# Patient Record
Sex: Female | Born: 1975 | Race: White | Hispanic: No | Marital: Single | Smoking: Current some day smoker
Health system: Southern US, Community
[De-identification: ages and names within clinical notes are randomized; demographics above are authoritative.]

## PROBLEM LIST (undated history)

## (undated) DIAGNOSIS — I341 Nonrheumatic mitral (valve) prolapse: Secondary | ICD-10-CM

## (undated) DIAGNOSIS — Z8489 Family history of other specified conditions: Secondary | ICD-10-CM

## (undated) DIAGNOSIS — F32A Depression, unspecified: Secondary | ICD-10-CM

## (undated) DIAGNOSIS — F419 Anxiety disorder, unspecified: Secondary | ICD-10-CM

## (undated) DIAGNOSIS — R519 Headache, unspecified: Secondary | ICD-10-CM

## (undated) DIAGNOSIS — K219 Gastro-esophageal reflux disease without esophagitis: Secondary | ICD-10-CM

## (undated) HISTORY — PX: OTHER SURGICAL HISTORY: SHX169

---

## 2007-11-12 ENCOUNTER — Emergency Department (HOSPITAL_COMMUNITY): Admission: EM | Admit: 2007-11-12 | Discharge: 2007-11-12 | Payer: Self-pay | Admitting: Emergency Medicine

## 2008-01-25 ENCOUNTER — Emergency Department (HOSPITAL_COMMUNITY): Admission: EM | Admit: 2008-01-25 | Discharge: 2008-01-25 | Payer: Self-pay | Admitting: Emergency Medicine

## 2008-04-23 ENCOUNTER — Other Ambulatory Visit: Admission: RE | Admit: 2008-04-23 | Discharge: 2008-04-23 | Payer: Self-pay | Admitting: Obstetrics and Gynecology

## 2008-09-06 ENCOUNTER — Encounter: Admission: RE | Admit: 2008-09-06 | Discharge: 2008-09-06 | Payer: Self-pay | Admitting: Surgical Oncology

## 2010-05-03 ENCOUNTER — Other Ambulatory Visit: Admission: RE | Admit: 2010-05-03 | Discharge: 2010-05-03 | Payer: Self-pay | Admitting: Obstetrics and Gynecology

## 2011-06-23 LAB — I-STAT 8, (EC8 V) (CONVERTED LAB)
Acid-Base Excess: 1
Chloride: 105
Glucose, Bld: 209 — ABNORMAL HIGH
Hemoglobin: 15.3 — ABNORMAL HIGH
Potassium: 3.7
Sodium: 136
pH, Ven: 7.532 — ABNORMAL HIGH

## 2011-06-27 LAB — URINALYSIS, ROUTINE W REFLEX MICROSCOPIC
Ketones, ur: >80 — AB
Nitrite: NEGATIVE
Specific Gravity, Urine: 1.027
Urobilinogen, UA: 0.2
pH: 6.5

## 2019-07-07 ENCOUNTER — Ambulatory Visit
Admission: EM | Admit: 2019-07-07 | Discharge: 2019-07-07 | Disposition: A | Discharge status: Home / Self Care | Payer: No Typology Code available for payment source

## 2019-07-07 DIAGNOSIS — R1011 Right upper quadrant pain: Secondary | ICD-10-CM | POA: Diagnosis not present

## 2019-07-07 HISTORY — DX: Nonrheumatic mitral (valve) prolapse: I34.1

## 2019-07-07 NOTE — ED Provider Notes (Signed)
[facility]    CSN: [id number] Arrival date & time: 07/07/19  [id number]      History   Chief Complaint Chief Complaint  Patient presents with  . Abdominal Pain    HPI SHARMEKA Mercado is a 43 y.o. female presenting for right upper quadrant pain since Wednesday.  States is been waxing and waning, though over the last few days has become more constant.  States that it is "like a full sensation ".  States is worse after eating, though now just "all the time ".  Patient states pain radiates to her epigastric area, mid back.  Patient endorsing nausea and early satiety, no vomiting.  Patient has not had episodes similar to this previous to Wednesday.  Patient does report history of gastric ulcers and H. pylori, though states "this does not feel like any of those ".  States that this occurred in 2001.  Patient denies history of abdominal surgery, though states that her paternal aunt, grandmother, and her sister all had their gallbladder removed.   Past Medical History:  Diagnosis Date  . Mitral valve prolapse     There are no active problems to display for this patient.   History reviewed. No pertinent surgical history.  OB History   No obstetric history on file.      Home Medications    Prior to Admission medications   Not on File    Family History No family history on file.  Social History Social History   Tobacco Use  . Smoking status: Current Some Day Smoker  . Smokeless tobacco: Never Used  Substance Use Topics  . Alcohol use: Yes  . Drug use: Not on file     Allergies   Patient has no known allergies.   Review of Systems Review of Systems  Constitutional: Positive for appetite change. Negative for fatigue and fever.  Eyes: Negative for pain, redness and visual disturbance.  Respiratory: Negative for cough and shortness of breath.   Cardiovascular: Negative for chest pain and palpitations.  Gastrointestinal: Positive for abdominal pain. Negative  for abdominal distention, blood in stool, diarrhea and vomiting.  Musculoskeletal: Negative for arthralgias and myalgias.  Skin: Negative for rash and wound.  Neurological: Negative for weakness and headaches.     Physical Exam Triage Vital Signs ED Triage Vitals  Enc Vitals Group     BP      Pulse      Resp      Temp      Temp src      SpO2      Weight      Height      Head Circumference      Peak Flow      Pain Score      Pain Loc      Pain Edu?      Excl. in [place]?    No data found.  Updated Vital Signs BP (!) 143/84 (BP Location: Left Arm)   Pulse 95   Temp 98.2 F (36.8 C) (Oral)   Resp 20   LMP 07/05/2019   SpO2 96%   Visual Acuity Right Eye Distance:   Left Eye Distance:   Bilateral Distance:    Right Eye Near:   Left Eye Near:    Bilateral Near:     Physical Exam Constitutional:      General: She is not in acute distress.    Appearance: She is well-developed. She is not toxic-appearing.  HENT:  Head: Normocephalic and atraumatic.  Eyes:     General: No scleral icterus.    Pupils: Pupils are equal, round, and reactive to light.  Cardiovascular:     Rate and Rhythm: Normal rate and regular rhythm.     Heart sounds: No murmur. No gallop.   Pulmonary:     Effort: Pulmonary effort is normal. No respiratory distress.     Breath sounds: No wheezing.  Abdominal:     General: Abdomen is flat. Bowel sounds are normal. There is no distension or abdominal bruit.     Palpations: Abdomen is soft. There is no hepatomegaly, splenomegaly or pulsatile mass.     Tenderness: There is abdominal tenderness in the right upper quadrant. There is no right CVA tenderness, left CVA tenderness, guarding or rebound. Positive signs include Murphy's sign. Negative signs include Rovsing's sign and McBurney's sign.     Hernia: No hernia is present.  Skin:    Coloration: Skin is not jaundiced or pale.  Neurological:     Mental Status: She is alert and oriented to person,  place, and time.      UC Treatments / Results  Labs (all labs ordered are listed, but only abnormal results are displayed) Labs Reviewed - No data to display  EKG   Radiology No results found.  Procedures Procedures (including critical care time)  Medications Ordered in UC Medications - No data to display  Initial Impression / Assessment and Plan / UC Course  I have reviewed the triage vital signs and the nursing notes.  Pertinent labs & imaging results that were available during my care of the patient were reviewed by me and considered in my medical decision making (see chart for details).     1.  Right upper quadrant pain History and physical highly suggestive of cholelithiasis, possible cholecystitis.  Patient is afebrile, nontoxic, hemodynamically stable.  Lower suspicion for gastritis, ulcer, H. pylori given patient's history.  Provided surgical contact information for further evaluation as patient is not routinely followed by primary care.  Reviewed strict ER return precautions in the interim, patient verbalized understanding and is agreeable to plan. Final Clinical Impressions(s) / UC Diagnoses   Final diagnoses:  RUQ pain     Discharge Instructions     Eat small, low-fat meals & drink plenty of water throughout the day. Go to ER sooner than surgical appointment if you develop fever, worsening pain, vomiting    ED Prescriptions    None     PDMP not reviewed this encounter.   [place], [place], [place] 07/07/19 1025

## 2019-07-07 NOTE — Discharge Instructions (Addendum)
Eat small, low-fat meals & drink plenty of water throughout the day. Go to ER sooner than surgical appointment if you develop fever, worsening pain, vomiting

## 2019-07-07 NOTE — ED Triage Notes (Signed)
Pt c/o upper rt/center abdominal pain radiating through to back since last Wednesday. States worse after eating.

## 2019-07-09 ENCOUNTER — Other Ambulatory Visit: Payer: Self-pay

## 2019-07-09 ENCOUNTER — Other Ambulatory Visit: Payer: Self-pay | Admitting: Surgery

## 2019-07-09 ENCOUNTER — Ambulatory Visit (HOSPITAL_COMMUNITY)
Admission: RE | Admit: 2019-07-09 | Discharge: 2019-07-09 | Disposition: A | Discharge status: Home / Self Care | Payer: PRIVATE HEALTH INSURANCE | Source: Ambulatory Visit | Attending: Surgery | Admitting: Surgery

## 2019-07-09 DIAGNOSIS — R1013 Epigastric pain: Secondary | ICD-10-CM | POA: Insufficient documentation

## 2019-07-11 ENCOUNTER — Other Ambulatory Visit (HOSPITAL_COMMUNITY): Payer: Self-pay | Admitting: Surgery

## 2019-07-11 ENCOUNTER — Other Ambulatory Visit: Payer: Self-pay | Admitting: Surgery

## 2019-07-11 DIAGNOSIS — R1013 Epigastric pain: Secondary | ICD-10-CM

## 2019-07-25 ENCOUNTER — Other Ambulatory Visit: Payer: Self-pay

## 2019-07-25 ENCOUNTER — Ambulatory Visit (HOSPITAL_COMMUNITY)
Admission: RE | Admit: 2019-07-25 | Discharge: 2019-07-25 | Disposition: A | Discharge status: Home / Self Care | Payer: PRIVATE HEALTH INSURANCE | Source: Ambulatory Visit | Attending: Surgery | Admitting: Surgery

## 2019-07-25 DIAGNOSIS — R1013 Epigastric pain: Secondary | ICD-10-CM | POA: Insufficient documentation

## 2019-07-25 MED ORDER — TECHNETIUM TC 99M MEBROFENIN IV KIT
5.0000 | PACK | Freq: Once | INTRAVENOUS | Status: AC | PRN
  Administered 2019-07-25: 5 via INTRAVENOUS

## 2019-11-04 ENCOUNTER — Other Ambulatory Visit: Payer: Self-pay | Admitting: Gastroenterology

## 2019-11-04 DIAGNOSIS — R109 Unspecified abdominal pain: Secondary | ICD-10-CM

## 2019-11-05 ENCOUNTER — Telehealth: Payer: Self-pay

## 2019-11-05 NOTE — Telephone Encounter (Signed)
Phone call to patient to review instructions for 13 hr prep for CT w/ contrast on 11/12/2019  at 1140 AM. Prescription called into [facility]. Pt aware and verbalized understanding of instructions. Prescription: 11/11/19 1040 PM- 50mg  Prednisone 11/12/19 440 AM- 50mg  Prednisone 11/12/19 1040 AM - 50mg  Prednisone and 50mg  Benadryl

## 2019-11-12 ENCOUNTER — Ambulatory Visit
Admission: RE | Admit: 2019-11-12 | Discharge: 2019-11-12 | Disposition: A | Discharge status: Home / Self Care | Payer: PRIVATE HEALTH INSURANCE | Source: Ambulatory Visit | Attending: Gastroenterology | Admitting: Gastroenterology

## 2019-11-12 ENCOUNTER — Other Ambulatory Visit: Payer: Self-pay

## 2019-11-12 DIAGNOSIS — R109 Unspecified abdominal pain: Secondary | ICD-10-CM

## 2019-11-12 MED ORDER — IOPAMIDOL (ISOVUE-300) INJECTION 61%
100.0000 mL | Freq: Once | INTRAVENOUS | Status: AC | PRN
  Administered 2019-11-12: 12:00:00 100 mL via INTRAVENOUS

## 2020-01-06 ENCOUNTER — Other Ambulatory Visit: Payer: Self-pay | Admitting: Nurse Practitioner

## 2020-01-06 DIAGNOSIS — Z1231 Encounter for screening mammogram for malignant neoplasm of breast: Secondary | ICD-10-CM

## 2020-01-16 ENCOUNTER — Other Ambulatory Visit: Payer: Self-pay

## 2020-01-16 ENCOUNTER — Ambulatory Visit
Admission: RE | Admit: 2020-01-16 | Discharge: 2020-01-16 | Disposition: A | Discharge status: Home / Self Care | Payer: PRIVATE HEALTH INSURANCE | Source: Ambulatory Visit | Attending: Nurse Practitioner | Admitting: Nurse Practitioner

## 2020-01-16 DIAGNOSIS — Z1231 Encounter for screening mammogram for malignant neoplasm of breast: Secondary | ICD-10-CM

## 2020-01-19 ENCOUNTER — Other Ambulatory Visit: Payer: Self-pay | Admitting: Nurse Practitioner

## 2020-01-19 DIAGNOSIS — R928 Other abnormal and inconclusive findings on diagnostic imaging of breast: Secondary | ICD-10-CM

## 2020-01-29 ENCOUNTER — Ambulatory Visit
Admission: RE | Admit: 2020-01-29 | Discharge: 2020-01-29 | Disposition: A | Discharge status: Home / Self Care | Payer: PRIVATE HEALTH INSURANCE | Source: Ambulatory Visit | Attending: Nurse Practitioner | Admitting: Nurse Practitioner

## 2020-01-29 ENCOUNTER — Other Ambulatory Visit: Payer: Self-pay | Admitting: Nurse Practitioner

## 2020-01-29 ENCOUNTER — Other Ambulatory Visit: Payer: Self-pay

## 2020-01-29 DIAGNOSIS — R921 Mammographic calcification found on diagnostic imaging of breast: Secondary | ICD-10-CM

## 2020-01-29 DIAGNOSIS — R928 Other abnormal and inconclusive findings on diagnostic imaging of breast: Secondary | ICD-10-CM

## 2020-01-29 DIAGNOSIS — N6320 Unspecified lump in the left breast, unspecified quadrant: Secondary | ICD-10-CM

## 2020-05-27 ENCOUNTER — Other Ambulatory Visit (HOSPITAL_COMMUNITY): Payer: Self-pay | Admitting: Gastroenterology

## 2020-05-27 ENCOUNTER — Other Ambulatory Visit: Payer: Self-pay | Admitting: Gastroenterology

## 2020-05-27 DIAGNOSIS — R110 Nausea: Secondary | ICD-10-CM

## 2020-06-01 ENCOUNTER — Encounter (HOSPITAL_COMMUNITY)
Admission: RE | Admit: 2020-06-01 | Discharge: 2020-06-01 | Disposition: A | Discharge status: Home / Self Care | Payer: PRIVATE HEALTH INSURANCE | Source: Ambulatory Visit | Attending: Gastroenterology | Admitting: Gastroenterology

## 2020-06-01 ENCOUNTER — Other Ambulatory Visit: Payer: Self-pay

## 2020-06-01 DIAGNOSIS — R110 Nausea: Secondary | ICD-10-CM

## 2020-06-01 MED ORDER — TECHNETIUM TC 99M SULFUR COLLOID
2.0000 | Freq: Once | INTRAVENOUS | Status: AC | PRN
  Administered 2020-06-01: 2 via ORAL

## 2020-06-21 ENCOUNTER — Ambulatory Visit: Payer: Self-pay | Admitting: Surgery

## 2020-06-21 NOTE — H&P (View-Only) (Signed)
CLARITA MCELVAIN Appointment: 06/21/2020 10:15 AM Location: [facility] Patient #: [id number] DOB: 12/26/1975 Single / Language: Cleophus Molt / Race: White Female  History of Present Illness Adin Hector MD; 06/21/2020 11:13 AM) The patient is a 44 year old female who presents with abdominal pain. Note for "Abdominal pain": ` ` ` Patient sent for surgical consultation at the request of Dr Alessandra Bevels  Chief Complaint: Persistent postprandial right upper quadrant pain. Reconsider gallbladder etiology. ` ` The patient is a pleasant active woman who struggled with intermittent abdominal pain for the past year. She gets episodes of short right upper quadrant pain. He'll full bloated. Usually triggered by. Fatty foods. Frighten deli meats. Protein focus. She wondered it was Deary related. She's tried to avoid that and still getting attacks. Usually starts about 30 minutes after she eats and lasts for several hours. Often with a more severe attack it can last for several days and she feels nauseated to most anything. She went to the emergency room last year. CAT scan negative for cholecystitis or appendicitis. Surgical follow-up recommended. Better colic suspected. Ultrasound normal. HIDA scan showed normal gallbladder emptying. Referred to gastroenterology. EGD underwhelming with small polyp. No heartburn or reflux. She's been on Protonix every day without improvement in symptoms. Imaging study was completely normal. Discussion with her she did note she got a recurrent attack with a HIDA scan. Surgical consultation offered again.  Patient claims she moves her bowels every day. No regular bowels. No bleeding or cramping. No irritable bowel. She rarely drinks any alcohol. She smokes 1 cigarette a day at the most. No other medications or drugs. Can walk normally a half hour without difficulty. She does get some right upper quadrant discomfort if she walks more than 15  minutes. She can sleep supine. No dysphagia to solids or liquids. No severe burping or belching. No history of pneumonias or asthma. No family history of bowel problems or digestive problems. No history of inflammatory bowel disease. No hematochezia or melena. No hematemesis.  (Review of systems as stated in this history (HPI) or in the review of systems. Otherwise all other 12 point ROS are negative) ` ` ###########################################`  This patient encounter took 45 minutes today to perform the following: obtain history, perform exam, review outside records, interpret tests & imaging, counsel the patient on their diagnosis; and, document this encounter, including findings & plan in the electronic health record (EHR).   Allergies (Chanel Teressa Senter, CMA; 06/21/2020 10:32 AM) Arby Barrette *ANALGESICS - NonNarcotic* Allergies Reconciled  Medication History (Chanel Teressa Senter, CMA; 06/21/2020 10:32 AM) Dicyclomine HCl (10MG  Capsule, Oral) Active. Pantoprazole Sodium (40MG  Tablet DR, Oral) Active. Medications Reconciled    Vitals (Chanel Nolan CMA; 06/21/2020 10:33 AM) 06/21/2020 10:32 AM Weight: 142.13 lb Height: 66in Body Surface Area: 1.73 m Body Mass Index: 22.94 kg/m  Temp.: 97.29F  Pulse: 134 (Regular)  BP: 110/74(Sitting, Left Arm, Standard)        Physical Exam Adin Hector MD; 06/21/2020 11:11 AM)  General Mental Status-Alert. General Appearance-Not in acute distress, Not Sickly. Orientation-Oriented X3. Hydration-Well hydrated. Voice-Normal.  Integumentary Global Assessment Upon inspection and palpation of skin surfaces of the - Axillae: non-tender, no inflammation or ulceration, no drainage. and Distribution of scalp and body hair is normal. General Characteristics Temperature - normal warmth is noted.  Head and Neck Head-normocephalic, atraumatic with no lesions or palpable masses. Face Global Assessment - atraumatic,  no absence of expression. Neck Global Assessment - no abnormal movements, no bruit auscultated on  the right, no bruit auscultated on the left, no decreased range of motion, non-tender. Trachea-midline. Thyroid Gland Characteristics - non-tender.  Eye Eyeball - Left-Extraocular movements intact, No Nystagmus - Left. Eyeball - Right-Extraocular movements intact, No Nystagmus - Right. Cornea - Left-No Hazy - Left. Cornea - Right-No Hazy - Right. Sclera/Conjunctiva - Left-No scleral icterus, No Discharge - Left. Sclera/Conjunctiva - Right-No scleral icterus, No Discharge - Right. Pupil - Left-Direct reaction to light normal. Pupil - Right-Direct reaction to light normal.  ENMT Ears Pinna - Left - no drainage observed, no generalized tenderness observed. Pinna - Right - no drainage observed, no generalized tenderness observed. Nose and Sinuses External Inspection of the Nose - no destructive lesion observed. Inspection of the nares - Left - quiet respiration. Inspection of the nares - Right - quiet respiration. Mouth and Throat Lips - Upper Lip - no fissures observed, no pallor noted. Lower Lip - no fissures observed, no pallor noted. Nasopharynx - no discharge present. Oral Cavity/Oropharynx - Tongue - no dryness observed. Oral Mucosa - no cyanosis observed. Hypopharynx - no evidence of airway distress observed.  Chest and Lung Exam Inspection Movements - Normal and Symmetrical. Accessory muscles - No use of accessory muscles in breathing. Palpation Palpation of the chest reveals - Non-tender. Auscultation Breath sounds - Normal and Clear.  Cardiovascular Auscultation Rhythm - Regular. Murmurs & Other Heart Sounds - Auscultation of the heart reveals - No Murmurs and No Systolic Clicks.  Abdomen Inspection Inspection of the abdomen reveals - No Visible peristalsis and No Abnormal pulsations. Umbilicus - No Bleeding, No Urine  drainage. Palpation/Percussion Palpation and Percussion of the abdomen reveal - Soft, Non Tender, No Rebound tenderness, No Rigidity (guarding) and No Cutaneous hyperesthesia. Note: Abdomen soft. Soft and flat. Discomfort along right subcostal ridge. No true Murphy sign. No epigastric or left upper quadrant pain. No other abdominal pain. No right posterior flank pain. Not severely distended. No diastasis recti. No umbilical or other anterior abdominal wall hernias  Female Genitourinary Sexual Maturity Tanner 5 - Adult hair pattern. Note: No vaginal bleeding nor discharge  Peripheral Vascular Upper Extremity Inspection - Left - No Cyanotic nailbeds - Left, Not Ischemic. Inspection - Right - No Cyanotic nailbeds - Right, Not Ischemic.  Neurologic Neurologic evaluation reveals -normal attention span and ability to concentrate, able to name objects and repeat phrases. Appropriate fund of knowledge , normal sensation and normal coordination. Mental Status Affect - not angry, not paranoid. Cranial Nerves-Normal Bilaterally. Gait-Normal.  Neuropsychiatric Mental status exam performed with findings of-able to articulate well with normal speech/language, rate, volume and coherence, thought content normal with ability to perform basic computations and apply abstract reasoning and no evidence of hallucinations, delusions, obsessions or homicidal/suicidal ideation.  Musculoskeletal Global Assessment Spine, Ribs and Pelvis - no instability, subluxation or laxity. Right Upper Extremity - no instability, subluxation or laxity.  Lymphatic Head & Neck  General Head & Neck Lymphatics: Bilateral - Description - No Localized lymphadenopathy. Axillary  General Axillary Region: Bilateral - Description - No Localized lymphadenopathy. Femoral & Inguinal  Generalized Femoral & Inguinal Lymphatics: Left - Description - No Localized lymphadenopathy. Right - Description - No Localized  lymphadenopathy.    Assessment & Plan Adin Hector MD; 06/21/2020 11:14 AM)  CHRONIC CHOLECYSTITIS WITHOUT CALCULUS (K81.1) Impression: Very classic story biliary colic but otherwise underwhelming workup. No gallstones. Gallbladder emptying pretty decent. However she tells me she had a recurrent attack during the HIDA scan felt classic for that.  She is  done her due diligence and seen gastroneurology. Negative gastric emptying study. No concerning signs for irritable bowel. Some dairy intolerance but still with attacks with dietary changes. Compliance on an antacid medications. Some triggering with activity but for the most part it does not seem [place] skeletal to me. No hepatic or pancreatic issues. No cardiopulmonary issues.  She is miserable.  I think it is reasonable offer cholecystectomy since she has very classic story with otherwise negative workup. I did caution her that there is a chance this is not solve the problem and there is some other etiology but with reproduction of symptoms during HIDA scan and in the absence of help with antiacids, antispasmodics, dietary changes, and the rest of the differential diagnosis underwhelming; reasonable to offer cholecystectomy. Low threshold for liver biopsy. This would allow diagnostic laparoscopy as well. She wishes to proceed.  Current Plans You are being scheduled for surgery- Our schedulers will call you.  You should hear from our office's scheduling department within 5 working days about the location, date, and time of surgery. We try to make accommodations for patient's preferences in scheduling surgery, but sometimes the OR schedule or the surgeon's schedule prevents [place] from making those accommodations.  If you have not heard from our office [phone]) in 5 working days, call the office and ask for your surgeon's nurse.  If you have other questions about your diagnosis, plan, or surgery, call the office and ask for your  surgeon's nurse.  Written instructions provided Pt Education - Pamphlet Given - Laparoscopic Gallbladder Surgery: discussed with patient and provided information. The anatomy & physiology of hepatobiliary & pancreatic function was discussed. The pathophysiology of gallbladder dysfunction was discussed. Natural history risks without surgery was discussed. I feel the risks of no intervention will lead to serious problems that outweigh the operative risks; therefore, I recommended cholecystectomy to remove the pathology. I explained laparoscopic techniques with possible need for an open approach. Probable cholangiogram to evaluate the bilary tract was explained as well.  Risks such as bleeding, infection, abscess, leak, injury to other organs, need for further treatment, heart attack, death, and other risks were discussed. I noted a good likelihood this will help address the problem. Possibility that this will not correct all abdominal symptoms was explained. Goals of post-operative recovery were discussed as well. We will work to minimize complications. An educational handout further explaining the pathology and treatment options was given as well. Questions were answered. The patient expresses understanding & wishes to proceed with surgery.  Pt Education - [facility] Laparosopic Post Op HCI (Emiyah Spraggins) Pt Education - [facility] Good Bowel Health (Jizelle Conkey) Pt Education - Laparoscopic Cholecystectomy: gallbladder  Adin Hector, MD, FACS, MASCRS Gastrointestinal and Minimally Invasive Surgery  [facility] [address] [place], [place] [address] [phone] Fax [phone] Main/Paging  CONTACT INFORMATION: Weekday (9AM-5PM) concerns: Call [facility] main office at [phone] Weeknight (5PM-9AM) or Weekend/Holiday concerns: Check [email or web address] for General Surgery [facility] coverage (Please, do not use SecureChat as it is not reliable communication to operating surgeons for  immediate patient care)

## 2020-06-21 NOTE — H&P (Signed)
CALAIS SVEHLA Appointment: 06/21/2020 10:15 AM Location: [facility] Patient #: [id number] DOB: 29-Jun-1976 Single / Language: Cleophus Molt / Race: White Female  History of Present Illness Adin Hector MD; 06/21/2020 11:13 AM) The patient is a 44 year old female who presents with abdominal pain. Note for "Abdominal pain": ` ` ` Patient sent for surgical consultation at the request of Dr Alessandra Bevels  Chief Complaint: Persistent postprandial right upper quadrant pain. Reconsider gallbladder etiology. ` ` The patient is a pleasant active woman who struggled with intermittent abdominal pain for the past year. She gets episodes of short right upper quadrant pain. He'll full bloated. Usually triggered by. Fatty foods. Frighten deli meats. Protein focus. She wondered it was Deary related. She's tried to avoid that and still getting attacks. Usually starts about 30 minutes after she eats and lasts for several hours. Often with a more severe attack it can last for several days and she feels nauseated to most anything. She went to the emergency room last year. CAT scan negative for cholecystitis or appendicitis. Surgical follow-up recommended. Better colic suspected. Ultrasound normal. HIDA scan showed normal gallbladder emptying. Referred to gastroenterology. EGD underwhelming with small polyp. No heartburn or reflux. She's been on Protonix every day without improvement in symptoms. Imaging study was completely normal. Discussion with her she did note she got a recurrent attack with a HIDA scan. Surgical consultation offered again.  Patient claims she moves her bowels every day. No regular bowels. No bleeding or cramping. No irritable bowel. She rarely drinks any alcohol. She smokes 1 cigarette a day at the most. No other medications or drugs. Can walk normally a half hour without difficulty. She does get some right upper quadrant discomfort if she walks more than 15  minutes. She can sleep supine. No dysphagia to solids or liquids. No severe burping or belching. No history of pneumonias or asthma. No family history of bowel problems or digestive problems. No history of inflammatory bowel disease. No hematochezia or melena. No hematemesis.  (Review of systems as stated in this history (HPI) or in the review of systems. Otherwise all other 12 point ROS are negative) ` ` ###########################################`  This patient encounter took 45 minutes today to perform the following: obtain history, perform exam, review outside records, interpret tests & imaging, counsel the patient on their diagnosis; and, document this encounter, including findings & plan in the electronic health record (EHR).   Allergies (Chanel Teressa Senter, CMA; 06/21/2020 10:32 AM) Arby Barrette *ANALGESICS - NonNarcotic* Allergies Reconciled  Medication History (Chanel Teressa Senter, CMA; 06/21/2020 10:32 AM) Dicyclomine HCl (10MG  Capsule, Oral) Active. Pantoprazole Sodium (40MG  Tablet DR, Oral) Active. Medications Reconciled    Vitals (Chanel Nolan CMA; 06/21/2020 10:33 AM) 06/21/2020 10:32 AM Weight: 142.13 lb Height: 66in Body Surface Area: 1.73 m Body Mass Index: 22.94 kg/m  Temp.: 97.50F  Pulse: 134 (Regular)  BP: 110/74(Sitting, Left Arm, Standard)        Physical Exam Adin Hector MD; 06/21/2020 11:11 AM)  General Mental Status-Alert. General Appearance-Not in acute distress, Not Sickly. Orientation-Oriented X3. Hydration-Well hydrated. Voice-Normal.  Integumentary Global Assessment Upon inspection and palpation of skin surfaces of the - Axillae: non-tender, no inflammation or ulceration, no drainage. and Distribution of scalp and body hair is normal. General Characteristics Temperature - normal warmth is noted.  Head and Neck Head-normocephalic, atraumatic with no lesions or palpable masses. Face Global Assessment - atraumatic,  no absence of expression. Neck Global Assessment - no abnormal movements, no bruit auscultated on  the right, no bruit auscultated on the left, no decreased range of motion, non-tender. Trachea-midline. Thyroid Gland Characteristics - non-tender.  Eye Eyeball - Left-Extraocular movements intact, No Nystagmus - Left. Eyeball - Right-Extraocular movements intact, No Nystagmus - Right. Cornea - Left-No Hazy - Left. Cornea - Right-No Hazy - Right. Sclera/Conjunctiva - Left-No scleral icterus, No Discharge - Left. Sclera/Conjunctiva - Right-No scleral icterus, No Discharge - Right. Pupil - Left-Direct reaction to light normal. Pupil - Right-Direct reaction to light normal.  ENMT Ears Pinna - Left - no drainage observed, no generalized tenderness observed. Pinna - Right - no drainage observed, no generalized tenderness observed. Nose and Sinuses External Inspection of the Nose - no destructive lesion observed. Inspection of the nares - Left - quiet respiration. Inspection of the nares - Right - quiet respiration. Mouth and Throat Lips - Upper Lip - no fissures observed, no pallor noted. Lower Lip - no fissures observed, no pallor noted. Nasopharynx - no discharge present. Oral Cavity/Oropharynx - Tongue - no dryness observed. Oral Mucosa - no cyanosis observed. Hypopharynx - no evidence of airway distress observed.  Chest and Lung Exam Inspection Movements - Normal and Symmetrical. Accessory muscles - No use of accessory muscles in breathing. Palpation Palpation of the chest reveals - Non-tender. Auscultation Breath sounds - Normal and Clear.  Cardiovascular Auscultation Rhythm - Regular. Murmurs & Other Heart Sounds - Auscultation of the heart reveals - No Murmurs and No Systolic Clicks.  Abdomen Inspection Inspection of the abdomen reveals - No Visible peristalsis and No Abnormal pulsations. Umbilicus - No Bleeding, No Urine  drainage. Palpation/Percussion Palpation and Percussion of the abdomen reveal - Soft, Non Tender, No Rebound tenderness, No Rigidity (guarding) and No Cutaneous hyperesthesia. Note: Abdomen soft. Soft and flat. Discomfort along right subcostal ridge. No true Murphy sign. No epigastric or left upper quadrant pain. No other abdominal pain. No right posterior flank pain. Not severely distended. No diastasis recti. No umbilical or other anterior abdominal wall hernias  Female Genitourinary Sexual Maturity Tanner 5 - Adult hair pattern. Note: No vaginal bleeding nor discharge  Peripheral Vascular Upper Extremity Inspection - Left - No Cyanotic nailbeds - Left, Not Ischemic. Inspection - Right - No Cyanotic nailbeds - Right, Not Ischemic.  Neurologic Neurologic evaluation reveals -normal attention span and ability to concentrate, able to name objects and repeat phrases. Appropriate fund of knowledge , normal sensation and normal coordination. Mental Status Affect - not angry, not paranoid. Cranial Nerves-Normal Bilaterally. Gait-Normal.  Neuropsychiatric Mental status exam performed with findings of-able to articulate well with normal speech/language, rate, volume and coherence, thought content normal with ability to perform basic computations and apply abstract reasoning and no evidence of hallucinations, delusions, obsessions or homicidal/suicidal ideation.  Musculoskeletal Global Assessment Spine, Ribs and Pelvis - no instability, subluxation or laxity. Right Upper Extremity - no instability, subluxation or laxity.  Lymphatic Head & Neck  General Head & Neck Lymphatics: Bilateral - Description - No Localized lymphadenopathy. Axillary  General Axillary Region: Bilateral - Description - No Localized lymphadenopathy. Femoral & Inguinal  Generalized Femoral & Inguinal Lymphatics: Left - Description - No Localized lymphadenopathy. Right - Description - No Localized  lymphadenopathy.    Assessment & Plan Adin Hector MD; 06/21/2020 11:14 AM)  CHRONIC CHOLECYSTITIS WITHOUT CALCULUS (K81.1) Impression: Very classic story biliary colic but otherwise underwhelming workup. No gallstones. Gallbladder emptying pretty decent. However she tells me she had a recurrent attack during the HIDA scan felt classic for that.  She is  done her due diligence and seen gastroneurology. Negative gastric emptying study. No concerning signs for irritable bowel. Some dairy intolerance but still with attacks with dietary changes. Compliance on an antacid medications. Some triggering with activity but for the most part it does not seem [place] skeletal to me. No hepatic or pancreatic issues. No cardiopulmonary issues.  She is miserable.  I think it is reasonable offer cholecystectomy since she has very classic story with otherwise negative workup. I did caution her that there is a chance this is not solve the problem and there is some other etiology but with reproduction of symptoms during HIDA scan and in the absence of help with antiacids, antispasmodics, dietary changes, and the rest of the differential diagnosis underwhelming; reasonable to offer cholecystectomy. Low threshold for liver biopsy. This would allow diagnostic laparoscopy as well. She wishes to proceed.  Current Plans You are being scheduled for surgery- Our schedulers will call you.  You should hear from our office's scheduling department within 5 working days about the location, date, and time of surgery. We try to make accommodations for patient's preferences in scheduling surgery, but sometimes the OR schedule or the surgeon's schedule prevents [place] from making those accommodations.  If you have not heard from our office [phone]) in 5 working days, call the office and ask for your surgeon's nurse.  If you have other questions about your diagnosis, plan, or surgery, call the office and ask for your  surgeon's nurse.  Written instructions provided Pt Education - Pamphlet Given - Laparoscopic Gallbladder Surgery: discussed with patient and provided information. The anatomy & physiology of hepatobiliary & pancreatic function was discussed. The pathophysiology of gallbladder dysfunction was discussed. Natural history risks without surgery was discussed. I feel the risks of no intervention will lead to serious problems that outweigh the operative risks; therefore, I recommended cholecystectomy to remove the pathology. I explained laparoscopic techniques with possible need for an open approach. Probable cholangiogram to evaluate the bilary tract was explained as well.  Risks such as bleeding, infection, abscess, leak, injury to other organs, need for further treatment, heart attack, death, and other risks were discussed. I noted a good likelihood this will help address the problem. Possibility that this will not correct all abdominal symptoms was explained. Goals of post-operative recovery were discussed as well. We will work to minimize complications. An educational handout further explaining the pathology and treatment options was given as well. Questions were answered. The patient expresses understanding & wishes to proceed with surgery.  Pt Education - [facility] Laparosopic Post Op HCI (Denali Becvar) Pt Education - [facility] Good Bowel Health (Avondre Richens) Pt Education - Laparoscopic Cholecystectomy: gallbladder  Adin Hector, MD, FACS, MASCRS Gastrointestinal and Minimally Invasive Surgery  [facility] [address] [place], [place] [address] [phone] Fax [phone] Main/Paging  CONTACT INFORMATION: Weekday (9AM-5PM) concerns: Call [facility] main office at [phone] Weeknight (5PM-9AM) or Weekend/Holiday concerns: Check [email or web address] for General Surgery [facility] coverage (Please, do not use SecureChat as it is not reliable communication to operating surgeons for  immediate patient care)

## 2020-06-22 NOTE — Progress Notes (Signed)
DUE TO COVID-19 ONLY ONE VISITOR IS ALLOWED TO COME WITH YOU AND STAY IN THE WAITING ROOM ONLY DURING PRE OP AND PROCEDURE DAY OF SURGERY. THE 1 VISITOR  MAY VISIT WITH YOU AFTER SURGERY IN YOUR PRIVATE ROOM DURING VISITING HOURS ONLY!  YOU NEED TO HAVE A COVID 19 TEST 06/28/2020 @_______ , THIS TEST MUST BE DONE BEFORE SURGERY,  COVID TESTING SITE [address] [place] [address], IT IS ON THE RIGHT GOING OUT [address] APPROXIMATELY  2 MINUTES PAST [employer] ON THE RIGHT. ONCE YOUR COVID TEST IS COMPLETED,  PLEASE BEGIN THE QUARANTINE INSTRUCTIONS AS OUTLINED IN YOUR HANDOUT.                Maureen Mercado  06/22/2020   Your procedure is scheduled on: 07/01/20         Report to [facility] Main  Entrance  Report to Admitting at 100pm    Call this number if you have problems the morning of surgery [phone]    REMEMBER: NO  SOLID FOOD CANDY OR GUM AFTER MIDNIGHT. CLEAR LIQUIDS UNTIL  1200noon       . NOTHING BY MOUTH EXCEPT CLEAR LIQUIDS UNTIL    . PLEASE FINISH ENSURE DRINK PER SURGEON ORDER  WHICH NEEDS TO BE COMPLETED AT   1200noon   .      CLEAR LIQUID DIET   Foods Allowed                                                                    Coffee and tea, regular and decaf                            Fruit ices (not with fruit pulp)                                      Iced Popsicles                                    Carbonated beverages, regular and diet                                    Cranberry, grape and apple juices Sports drinks like Gatorade Lightly seasoned clear broth or consume(fat free) Sugar, honey syrup ___________________________________________________________________      BRUSH YOUR TEETH MORNING OF SURGERY AND RINSE YOUR MOUTH OUT, NO CHEWING GUM CANDY OR MINTS.     Take these medicines the morning of surgery with A SIP OF WATER:       Protonix                                 You may not have any metal on your  body including hair pins and              piercings  Do not wear jewelry, make-up, lotions, powders or perfumes, deodorant  Do not wear nail polish on your fingernails.  Do not shave  48 hours prior to surgery.              Men may shave face and neck.   Do not bring valuables to the hospital. [place].  Contacts, dentures or bridgework may not be worn into surgery.  Leave suitcase in the car. After surgery it may be brought to your room.     Patients discharged the day of surgery will not be allowed to drive home. IF YOU ARE HAVING SURGERY AND GOING HOME THE SAME DAY, YOU MUST HAVE AN ADULT TO DRIVE YOU HOME AND BE WITH YOU FOR 24 HOURS. YOU MAY GO HOME BY TAXI OR UBER OR ORTHERWISE, BUT AN ADULT MUST ACCOMPANY YOU HOME AND STAY WITH YOU FOR 24 HOURS.  Name and phone number of your driver:  Special Instructions: N/A              Please read over the following fact sheets you were given: _____________________________________________________________________  [facility] - Preparing for Surgery Before surgery, you can play an important role.  Because skin is not sterile, your skin needs to be as free of germs as possible.  You can reduce the number of germs on your skin by washing with CHG (chlorahexidine gluconate) soap before surgery.  CHG is an antiseptic cleaner which kills germs and bonds with the skin to continue killing germs even after washing. Please DO NOT use if you have an allergy to CHG or antibacterial soaps.  If your skin becomes reddened/irritated stop using the CHG and inform your nurse when you arrive at Short Stay. Do not shave (including legs and underarms) for at least 48 hours prior to the first CHG shower.  You may shave your face/neck. Please follow these instructions carefully:  1.  Shower with CHG Soap the night before surgery and the  morning of Surgery.  2.  If you choose to wash your hair, wash your hair  first as usual with your  normal  shampoo.  3.  After you shampoo, rinse your hair and body thoroughly to remove the  shampoo.                           4.  Use CHG as you would any other liquid soap.  You can apply chg directly  to the skin and wash                       Gently with a scrungie or clean washcloth.  5.  Apply the CHG Soap to your body ONLY FROM THE NECK DOWN.   Do not use on face/ open                           Wound or open sores. Avoid contact with eyes, ears mouth and genitals (private parts).                       Wash face,  Genitals (private parts) with your normal soap.             6.  Wash thoroughly, paying special attention to the area where your surgery  will be performed.  7.  Thoroughly rinse your body with  warm water from the neck down.  8.  DO NOT shower/wash with your normal soap after using and rinsing off  the CHG Soap.                9.  Pat yourself dry with a clean towel.            10.  Wear clean pajamas.            11.  Place clean sheets on your bed the night of your first shower and do not  sleep with pets. Day of Surgery : Do not apply any lotions/deodorants the morning of surgery.  Please wear clean clothes to the hospital/surgery center.  FAILURE TO FOLLOW THESE INSTRUCTIONS MAY RESULT IN THE CANCELLATION OF YOUR SURGERY PATIENT SIGNATURE_________________________________  NURSE SIGNATURE__________________________________  ________________________________________________________________________             DUE TO COVID-19 ONLY ONE VISITOR IS ALLOWED TO COME WITH YOU AND STAY IN THE WAITING ROOM ONLY DURING PRE OP AND PROCEDURE DAY OF SURGERY. THE 1 VISITOR  MAY VISIT WITH YOU AFTER SURGERY IN YOUR PRIVATE ROOM DURING VISITING HOURS ONLY!  YOU NEED TO HAVE A COVID 19 TEST ON_______ @_______ , THIS TEST MUST BE DONE BEFORE SURGERY,  COVID TESTING SITE [place] [address], IT IS ON THE RIGHT GOING OUT [address]  APPROXIMATELY  2 MINUTES PAST [employer] ON THE RIGHT. ONCE YOUR COVID TEST IS COMPLETED,  PLEASE BEGIN THE QUARANTINE INSTRUCTIONS AS OUTLINED IN YOUR HANDOUT.                Maureen Mercado  06/22/2020   Your procedure is scheduled on:    Report to [place]  Entrance   Report to admitting at AM     Call this number if you have problems the morning of surgery [phone]    REMEMBER: NO  SOLID FOOD CANDY OR GUM AFTER MIDNIGHT. CLEAR LIQUIDS UNTIL         . NOTHING BY MOUTH EXCEPT CLEAR LIQUIDS UNTIL    . PLEASE FINISH ENSURE DRINK PER SURGEON ORDER  WHICH NEEDS TO BE COMPLETED AT      .      CLEAR LIQUID DIET   Foods Allowed                                                                    Coffee and tea, regular and decaf                            Fruit ices (not with fruit pulp)                                      Iced Popsicles                                    Carbonated beverages, regular and diet  Cranberry, grape and apple juices Sports drinks like Gatorade Lightly seasoned clear broth or consume(fat free) Sugar, honey syrup ___________________________________________________________________      BRUSH YOUR TEETH MORNING OF SURGERY AND RINSE YOUR MOUTH OUT, NO CHEWING GUM CANDY OR MINTS.     Take these medicines the morning of surgery with A SIP OF WATER:   DO NOT TAKE ANY DIABETIC MEDICATIONS DAY OF YOUR SURGERY                               You may not have any metal on your body including hair pins and              piercings  Do not wear jewelry, make-up, lotions, powders or perfumes, deodorant             Do not wear nail polish on your fingernails.  Do not shave  48 hours prior to surgery.              Men may shave face and neck.   Do not bring valuables to the hospital. [place].  Contacts, dentures or bridgework may not be worn into  surgery.  Leave suitcase in the car. After surgery it may be brought to your room.     Patients discharged the day of surgery will not be allowed to drive home. IF YOU ARE HAVING SURGERY AND GOING HOME THE SAME DAY, YOU MUST HAVE AN ADULT TO DRIVE YOU HOME AND BE WITH YOU FOR 24 HOURS. YOU MAY GO HOME BY TAXI OR UBER OR ORTHERWISE, BUT AN ADULT MUST ACCOMPANY YOU HOME AND STAY WITH YOU FOR 24 HOURS.  Name and phone number of your driver:  Special Instructions: N/A              Please read over the following fact sheets you were given: _____________________________________________________________________  [facility] - Preparing for Surgery Before surgery, you can play an important role.  Because skin is not sterile, your skin needs to be as free of germs as possible.  You can reduce the number of germs on your skin by washing with CHG (chlorahexidine gluconate) soap before surgery.  CHG is an antiseptic cleaner which kills germs and bonds with the skin to continue killing germs even after washing. Please DO NOT use if you have an allergy to CHG or antibacterial soaps.  If your skin becomes reddened/irritated stop using the CHG and inform your nurse when you arrive at Short Stay. Do not shave (including legs and underarms) for at least 48 hours prior to the first CHG shower.  You may shave your face/neck. Please follow these instructions carefully:  1.  Shower with CHG Soap the night before surgery and the  morning of Surgery.  2.  If you choose to wash your hair, wash your hair first as usual with your  normal  shampoo.  3.  After you shampoo, rinse your hair and body thoroughly to remove the  shampoo.                           4.  Use CHG as you would any other liquid soap.  You can apply chg directly  to the skin and wash  Gently with a scrungie or clean washcloth.  5.  Apply the CHG Soap to your body ONLY FROM THE NECK DOWN.   Do not use on face/ open                            Wound or open sores. Avoid contact with eyes, ears mouth and genitals (private parts).                       Wash face,  Genitals (private parts) with your normal soap.             6.  Wash thoroughly, paying special attention to the area where your surgery  will be performed.  7.  Thoroughly rinse your body with warm water from the neck down.  8.  DO NOT shower/wash with your normal soap after using and rinsing off  the CHG Soap.                9.  Pat yourself dry with a clean towel.            10.  Wear clean pajamas.            11.  Place clean sheets on your bed the night of your first shower and do not  sleep with pets. Day of Surgery : Do not apply any lotions/deodorants the morning of surgery.  Please wear clean clothes to the hospital/surgery center.  FAILURE TO FOLLOW THESE INSTRUCTIONS MAY RESULT IN THE CANCELLATION OF YOUR SURGERY PATIENT SIGNATURE_________________________________  NURSE SIGNATURE__________________________________  ________________________________________________________________________

## 2020-06-25 ENCOUNTER — Encounter (HOSPITAL_COMMUNITY)
Admission: RE | Admit: 2020-06-25 | Discharge: 2020-06-25 | Disposition: A | Discharge status: Home / Self Care | Payer: PRIVATE HEALTH INSURANCE | Source: Ambulatory Visit | Attending: Surgery | Admitting: Surgery

## 2020-06-25 ENCOUNTER — Other Ambulatory Visit: Payer: Self-pay

## 2020-06-25 ENCOUNTER — Encounter (HOSPITAL_COMMUNITY): Payer: Self-pay

## 2020-06-25 DIAGNOSIS — Z01812 Encounter for preprocedural laboratory examination: Secondary | ICD-10-CM | POA: Insufficient documentation

## 2020-06-25 HISTORY — DX: Depression, unspecified: F32.A

## 2020-06-25 HISTORY — DX: Family history of other specified conditions: Z84.89

## 2020-06-25 HISTORY — DX: Anxiety disorder, unspecified: F41.9

## 2020-06-25 HISTORY — DX: Headache, unspecified: R51.9

## 2020-06-25 HISTORY — DX: Gastro-esophageal reflux disease without esophagitis: K21.9

## 2020-06-25 LAB — CBC
HCT: 40.5 % (ref 36.0–46.0)
Hemoglobin: 13.9 g/dL (ref 12.0–15.0)
MCH: 32.9 pg (ref 26.0–34.0)
MCHC: 34.3 g/dL (ref 30.0–36.0)
MCV: 96 fL (ref 80.0–100.0)
Platelets: 255 10*3/uL (ref 150–400)
RBC: 4.22 MIL/uL (ref 3.87–5.11)
RDW: 11.2 % — ABNORMAL LOW (ref 11.5–15.5)
WBC: 7.1 10*3/uL (ref 4.0–10.5)
nRBC: 0 % (ref 0.0–0.2)

## 2020-06-25 NOTE — Progress Notes (Signed)
AT time of preop on 06/25/20 Maureen Mercado made aware of temp of 99.7.  Patient voices no complaints.  Denies any covid symptoms.  NO new orders given.

## 2020-06-28 ENCOUNTER — Other Ambulatory Visit (HOSPITAL_COMMUNITY)
Admission: RE | Admit: 2020-06-28 | Discharge: 2020-06-28 | Disposition: A | Discharge status: Home / Self Care | Payer: PRIVATE HEALTH INSURANCE | Source: Ambulatory Visit | Attending: Surgery | Admitting: Surgery

## 2020-06-28 DIAGNOSIS — Z01812 Encounter for preprocedural laboratory examination: Secondary | ICD-10-CM | POA: Insufficient documentation

## 2020-06-28 DIAGNOSIS — Z20822 Contact with and (suspected) exposure to covid-19: Secondary | ICD-10-CM | POA: Diagnosis not present

## 2020-06-28 LAB — SARS CORONAVIRUS 2 (TAT 6-24 HRS): SARS Coronavirus 2: NEGATIVE

## 2020-06-30 MED ORDER — BUPIVACAINE LIPOSOME 1.3 % IJ SUSP
20.0000 mL | Freq: Once | INTRAMUSCULAR | Status: DC
  Filled 2020-06-30: qty 20

## 2020-07-01 ENCOUNTER — Ambulatory Visit (HOSPITAL_COMMUNITY)
Admission: RE | Admit: 2020-07-01 | Discharge: 2020-07-01 | Disposition: A | Discharge status: Home / Self Care | Payer: No Typology Code available for payment source | Attending: Surgery | Admitting: Surgery

## 2020-07-01 ENCOUNTER — Encounter (HOSPITAL_COMMUNITY)
Admission: RE | Disposition: A | Discharge status: Home / Self Care | Payer: Self-pay | Source: Home / Self Care | Attending: Surgery

## 2020-07-01 ENCOUNTER — Encounter (HOSPITAL_COMMUNITY): Payer: Self-pay | Admitting: Surgery

## 2020-07-01 ENCOUNTER — Ambulatory Visit (HOSPITAL_COMMUNITY): Payer: No Typology Code available for payment source | Admitting: Anesthesiology

## 2020-07-01 ENCOUNTER — Ambulatory Visit (HOSPITAL_COMMUNITY): Payer: No Typology Code available for payment source

## 2020-07-01 DIAGNOSIS — Z791 Long term (current) use of non-steroidal anti-inflammatories (NSAID): Secondary | ICD-10-CM | POA: Diagnosis not present

## 2020-07-01 DIAGNOSIS — K828 Other specified diseases of gallbladder: Secondary | ICD-10-CM | POA: Insufficient documentation

## 2020-07-01 DIAGNOSIS — K811 Chronic cholecystitis: Secondary | ICD-10-CM | POA: Insufficient documentation

## 2020-07-01 DIAGNOSIS — F419 Anxiety disorder, unspecified: Secondary | ICD-10-CM | POA: Insufficient documentation

## 2020-07-01 DIAGNOSIS — I341 Nonrheumatic mitral (valve) prolapse: Secondary | ICD-10-CM | POA: Insufficient documentation

## 2020-07-01 DIAGNOSIS — K219 Gastro-esophageal reflux disease without esophagitis: Secondary | ICD-10-CM | POA: Insufficient documentation

## 2020-07-01 DIAGNOSIS — Z79899 Other long term (current) drug therapy: Secondary | ICD-10-CM | POA: Diagnosis not present

## 2020-07-01 DIAGNOSIS — Z888 Allergy status to other drugs, medicaments and biological substances status: Secondary | ICD-10-CM | POA: Diagnosis not present

## 2020-07-01 DIAGNOSIS — F17210 Nicotine dependence, cigarettes, uncomplicated: Secondary | ICD-10-CM | POA: Diagnosis not present

## 2020-07-01 DIAGNOSIS — Z803 Family history of malignant neoplasm of breast: Secondary | ICD-10-CM | POA: Insufficient documentation

## 2020-07-01 DIAGNOSIS — F17200 Nicotine dependence, unspecified, uncomplicated: Secondary | ICD-10-CM | POA: Insufficient documentation

## 2020-07-01 DIAGNOSIS — R519 Headache, unspecified: Secondary | ICD-10-CM | POA: Insufficient documentation

## 2020-07-01 DIAGNOSIS — F32A Depression, unspecified: Secondary | ICD-10-CM | POA: Diagnosis present

## 2020-07-01 DIAGNOSIS — F329 Major depressive disorder, single episode, unspecified: Secondary | ICD-10-CM | POA: Insufficient documentation

## 2020-07-01 DIAGNOSIS — Z419 Encounter for procedure for purposes other than remedying health state, unspecified: Secondary | ICD-10-CM

## 2020-07-01 DIAGNOSIS — D225 Melanocytic nevi of trunk: Secondary | ICD-10-CM | POA: Insufficient documentation

## 2020-07-01 DIAGNOSIS — K8044 Calculus of bile duct with chronic cholecystitis without obstruction: Secondary | ICD-10-CM | POA: Diagnosis present

## 2020-07-01 HISTORY — PX: LAPAROSCOPIC CHOLECYSTECTOMY SINGLE SITE WITH INTRAOPERATIVE CHOLANGIOGRAM: SHX6538

## 2020-07-01 LAB — PREGNANCY, URINE: Preg Test, Ur: NEGATIVE

## 2020-07-01 SURGERY — LAPAROSCOPIC CHOLECYSTECTOMY SINGLE SITE WITH INTRAOPERATIVE CHOLANGIOGRAM
Anesthesia: General | Site: Abdomen

## 2020-07-01 MED ORDER — ACETAMINOPHEN 500 MG PO TABS: 1000.0000 mg | ORAL_TABLET | ORAL | Status: DC

## 2020-07-01 MED ORDER — ROCURONIUM BROMIDE 10 MG/ML (PF) SYRINGE
PREFILLED_SYRINGE | INTRAVENOUS | Status: DC | PRN
  Administered 2020-07-01: 70 mg via INTRAVENOUS

## 2020-07-01 MED ORDER — FENTANYL CITRATE (PF) 100 MCG/2ML IJ SOLN
25.0000 ug | INTRAMUSCULAR | Status: DC | PRN
  Administered 2020-07-01: 50 ug via INTRAVENOUS
  Administered 2020-07-01: 25 ug via INTRAVENOUS
  Administered 2020-07-01: 50 ug via INTRAVENOUS

## 2020-07-01 MED ORDER — FAMOTIDINE IN NACL 20-0.9 MG/50ML-% IV SOLN
20.0000 mg | Freq: Once | INTRAVENOUS | Status: AC
  Administered 2020-07-01: 20 mg via INTRAVENOUS
  Filled 2020-07-01: qty 50

## 2020-07-01 MED ORDER — DIPHENHYDRAMINE HCL 50 MG/ML IJ SOLN
INTRAMUSCULAR | Status: DC | PRN
  Administered 2020-07-01: 25 mg via INTRAVENOUS

## 2020-07-01 MED ORDER — CHLORHEXIDINE GLUCONATE CLOTH 2 % EX PADS: 6.0000 | MEDICATED_PAD | Freq: Once | CUTANEOUS | Status: DC

## 2020-07-01 MED ORDER — LIDOCAINE 2% (20 MG/ML) 5 ML SYRINGE
INTRAMUSCULAR | Status: DC | PRN
  Administered 2020-07-01: 100 mg via INTRAVENOUS

## 2020-07-01 MED ORDER — BUPIVACAINE-EPINEPHRINE (PF) 0.5% -1:200000 IJ SOLN
INTRAMUSCULAR | Status: AC
  Filled 2020-07-01: qty 30

## 2020-07-01 MED ORDER — LIDOCAINE 2% (20 MG/ML) 5 ML SYRINGE
INTRAMUSCULAR | Status: AC
  Filled 2020-07-01: qty 5

## 2020-07-01 MED ORDER — GABAPENTIN 300 MG PO CAPS
ORAL_CAPSULE | ORAL | Status: AC
  Administered 2020-07-01: 300 mg via ORAL
  Filled 2020-07-01: qty 1

## 2020-07-01 MED ORDER — FENTANYL CITRATE (PF) 250 MCG/5ML IJ SOLN
INTRAMUSCULAR | Status: DC | PRN
Start: 2020-07-01 — End: 2020-07-01
  Administered 2020-07-01 (×2): 50 ug via INTRAVENOUS
  Administered 2020-07-01: 100 ug via INTRAVENOUS
  Administered 2020-07-01: 50 ug via INTRAVENOUS

## 2020-07-01 MED ORDER — LACTATED RINGERS IV SOLN
INTRAVENOUS | Status: DC | PRN
  Administered 2020-07-01: 1000 mL

## 2020-07-01 MED ORDER — ACETAMINOPHEN 500 MG PO TABS: 1000.0000 mg | ORAL_TABLET | ORAL | Status: AC

## 2020-07-01 MED ORDER — FENTANYL CITRATE (PF) 100 MCG/2ML IJ SOLN
INTRAMUSCULAR | Status: AC
  Filled 2020-07-01: qty 2

## 2020-07-01 MED ORDER — PROMETHAZINE HCL 25 MG/ML IJ SOLN: 6.2500 mg | INTRAMUSCULAR | Status: DC | PRN

## 2020-07-01 MED ORDER — TRAMADOL HCL 50 MG PO TABS: 50.0000 mg | ORAL_TABLET | Freq: Four times a day (QID) | ORAL | 0 refills | Status: AC | PRN

## 2020-07-01 MED ORDER — MIDAZOLAM HCL 2 MG/2ML IJ SOLN
INTRAMUSCULAR | Status: DC | PRN
  Administered 2020-07-01: 2 mg via INTRAVENOUS

## 2020-07-01 MED ORDER — PHENYLEPHRINE 40 MCG/ML (10ML) SYRINGE FOR IV PUSH (FOR BLOOD PRESSURE SUPPORT)
PREFILLED_SYRINGE | INTRAVENOUS | Status: DC | PRN
  Administered 2020-07-01: 80 ug via INTRAVENOUS

## 2020-07-01 MED ORDER — DEXAMETHASONE SODIUM PHOSPHATE 10 MG/ML IJ SOLN
INTRAMUSCULAR | Status: DC | PRN
  Administered 2020-07-01: 8 mg via INTRAVENOUS

## 2020-07-01 MED ORDER — ENSURE PRE-SURGERY PO LIQD
296.0000 mL | Freq: Once | ORAL | Status: DC
  Filled 2020-07-01: qty 296

## 2020-07-01 MED ORDER — IOHEXOL 300 MG/ML  SOLN
INTRAMUSCULAR | Status: DC | PRN
  Administered 2020-07-01: 4 mL

## 2020-07-01 MED ORDER — 0.9 % SODIUM CHLORIDE (POUR BTL) OPTIME
TOPICAL | Status: DC | PRN
  Administered 2020-07-01: 1000 mL

## 2020-07-01 MED ORDER — GABAPENTIN 300 MG PO CAPS: 300.0000 mg | ORAL_CAPSULE | ORAL | Status: DC

## 2020-07-01 MED ORDER — SUGAMMADEX SODIUM 200 MG/2ML IV SOLN
INTRAVENOUS | Status: DC | PRN
  Administered 2020-07-01: 150 mg via INTRAVENOUS

## 2020-07-01 MED ORDER — ROCURONIUM BROMIDE 10 MG/ML (PF) SYRINGE
PREFILLED_SYRINGE | INTRAVENOUS | Status: AC
  Filled 2020-07-01: qty 10

## 2020-07-01 MED ORDER — BUPIVACAINE-EPINEPHRINE 0.25% -1:200000 IJ SOLN
INTRAMUSCULAR | Status: DC | PRN
  Administered 2020-07-01: 50 mL

## 2020-07-01 MED ORDER — ACETAMINOPHEN 500 MG PO TABS
ORAL_TABLET | ORAL | Status: AC
  Administered 2020-07-01: 1000 mg via ORAL
  Filled 2020-07-01: qty 2

## 2020-07-01 MED ORDER — PROPOFOL 10 MG/ML IV BOLUS
INTRAVENOUS | Status: AC
  Filled 2020-07-01: qty 20

## 2020-07-01 MED ORDER — PROPOFOL 10 MG/ML IV BOLUS
INTRAVENOUS | Status: DC | PRN
  Administered 2020-07-01: 20 mg via INTRAVENOUS
  Administered 2020-07-01: 110 mg via INTRAVENOUS

## 2020-07-01 MED ORDER — GABAPENTIN 300 MG PO CAPS: 300.0000 mg | ORAL_CAPSULE | ORAL | Status: AC

## 2020-07-01 MED ORDER — CHLORHEXIDINE GLUCONATE 0.12 % MT SOLN
15.0000 mL | Freq: Once | OROMUCOSAL | Status: AC
  Administered 2020-07-01: 15 mL via OROMUCOSAL

## 2020-07-01 MED ORDER — ONDANSETRON HCL 4 MG PO TABS: 4.0000 mg | ORAL_TABLET | Freq: Three times a day (TID) | ORAL | 5 refills | Status: AC | PRN

## 2020-07-01 MED ORDER — FENTANYL CITRATE (PF) 250 MCG/5ML IJ SOLN
INTRAMUSCULAR | Status: AC
  Filled 2020-07-01: qty 5

## 2020-07-01 MED ORDER — ORAL CARE MOUTH RINSE: 15.0000 mL | Freq: Once | OROMUCOSAL | Status: AC

## 2020-07-01 MED ORDER — AMISULPRIDE (ANTIEMETIC) 5 MG/2ML IV SOLN
10.0000 mg | Freq: Once | INTRAVENOUS | Status: AC | PRN
  Administered 2020-07-01: 10 mg via INTRAVENOUS

## 2020-07-01 MED ORDER — DEXAMETHASONE SODIUM PHOSPHATE 10 MG/ML IJ SOLN
INTRAMUSCULAR | Status: AC
  Filled 2020-07-01: qty 1

## 2020-07-01 MED ORDER — ONDANSETRON HCL 4 MG/2ML IJ SOLN
INTRAMUSCULAR | Status: AC
  Filled 2020-07-01: qty 2

## 2020-07-01 MED ORDER — BUPIVACAINE-EPINEPHRINE 0.25% -1:200000 IJ SOLN
INTRAMUSCULAR | Status: AC
  Filled 2020-07-01: qty 1

## 2020-07-01 MED ORDER — BUPIVACAINE LIPOSOME 1.3 % IJ SUSP
INTRAMUSCULAR | Status: DC | PRN
  Administered 2020-07-01: 20 mL

## 2020-07-01 MED ORDER — LACTATED RINGERS IV SOLN: INTRAVENOUS | Status: DC

## 2020-07-01 MED ORDER — MIDAZOLAM HCL 2 MG/2ML IJ SOLN
INTRAMUSCULAR | Status: AC
  Filled 2020-07-01: qty 2

## 2020-07-01 MED ORDER — ONDANSETRON HCL 4 MG/2ML IJ SOLN
INTRAMUSCULAR | Status: DC | PRN
  Administered 2020-07-01: 4 mg via INTRAVENOUS

## 2020-07-01 MED ORDER — AMISULPRIDE (ANTIEMETIC) 5 MG/2ML IV SOLN
INTRAVENOUS | Status: AC
  Filled 2020-07-01: qty 4

## 2020-07-01 SURGICAL SUPPLY — 46 items
APL PRP STRL LF DISP 70% ISPRP (MISCELLANEOUS) ×2
APPLIER CLIP 5 13 M/L LIGAMAX5 (MISCELLANEOUS) ×4
APR CLP MED LRG 5 ANG JAW (MISCELLANEOUS) ×2
BAG SPEC RTRVL 10 TROC 200 (ENDOMECHANICALS) ×2
CABLE HIGH FREQUENCY MONO STRZ (ELECTRODE) ×4 IMPLANT
CHLORAPREP W/TINT 26 (MISCELLANEOUS) ×4 IMPLANT
CLIP APPLIE 5 13 M/L LIGAMAX5 (MISCELLANEOUS) ×2 IMPLANT
COVER MAYO STAND STRL (DRAPES) ×4 IMPLANT
COVER SURGICAL LIGHT HANDLE (MISCELLANEOUS) ×4 IMPLANT
COVER WAND RF STERILE (DRAPES) IMPLANT
DECANTER SPIKE VIAL GLASS SM (MISCELLANEOUS) ×4 IMPLANT
DRAIN CHANNEL 19F RND (DRAIN) IMPLANT
DRAPE C-ARM 42X120 X-RAY (DRAPES) ×4 IMPLANT
DRAPE WARM FLUID 44X44 (DRAPES) ×4 IMPLANT
DRSG TEGADERM 4X4.75 (GAUZE/BANDAGES/DRESSINGS) ×4 IMPLANT
ELECT REM PT RETURN 15FT ADLT (MISCELLANEOUS) ×4 IMPLANT
ENDOLOOP SUT PDS II  0 18 (SUTURE)
ENDOLOOP SUT PDS II 0 18 (SUTURE) IMPLANT
EVACUATOR SILICONE 100CC (DRAIN) IMPLANT
GAUZE SPONGE 2X2 8PLY STRL LF (GAUZE/BANDAGES/DRESSINGS) ×2 IMPLANT
GLOVE ECLIPSE 8.0 STRL XLNG CF (GLOVE) ×4 IMPLANT
GLOVE INDICATOR 8.0 STRL GRN (GLOVE) ×4 IMPLANT
GOWN STRL REUS W/TWL XL LVL3 (GOWN DISPOSABLE) ×8 IMPLANT
IRRIG SUCT STRYKERFLOW 2 WTIP (MISCELLANEOUS) ×4
IRRIGATION SUCT STRKRFLW 2 WTP (MISCELLANEOUS) ×2 IMPLANT
KIT BASIN OR (CUSTOM PROCEDURE TRAY) ×4 IMPLANT
KIT TURNOVER KIT A (KITS) IMPLANT
PAD POSITIONING PINK XL (MISCELLANEOUS) ×4 IMPLANT
PENCIL SMOKE EVACUATOR (MISCELLANEOUS) IMPLANT
POUCH RETRIEVAL ECOSAC 10 (ENDOMECHANICALS) ×1 IMPLANT
POUCH RETRIEVAL ECOSAC 10MM (ENDOMECHANICALS) ×4
PROTECTOR NERVE ULNAR (MISCELLANEOUS) IMPLANT
SCISSORS LAP 5X35 DISP (ENDOMECHANICALS) ×4 IMPLANT
SET CHOLANGIOGRAPH MIX (MISCELLANEOUS) ×4 IMPLANT
SET TUBE SMOKE EVAC HIGH FLOW (TUBING) ×4 IMPLANT
SHEARS HARMONIC ACE PLUS 36CM (ENDOMECHANICALS) ×4 IMPLANT
SPONGE GAUZE 2X2 STER 10/PKG (GAUZE/BANDAGES/DRESSINGS) ×2
SUT MNCRL AB 4-0 PS2 18 (SUTURE) ×4 IMPLANT
SUT PDS AB 1 CT1 27 (SUTURE) ×8 IMPLANT
SUT VIC AB 3-0 SH 18 (SUTURE) ×3 IMPLANT
SYR 20ML LL LF (SYRINGE) ×4 IMPLANT
TOWEL OR 17X26 10 PK STRL BLUE (TOWEL DISPOSABLE) ×4 IMPLANT
TOWEL OR NON WOVEN STRL DISP B (DISPOSABLE) ×4 IMPLANT
TRAY LAPAROSCOPIC (CUSTOM PROCEDURE TRAY) ×4 IMPLANT
TROCAR BLADELESS OPT 5 100 (ENDOMECHANICALS) ×4 IMPLANT
TROCAR BLADELESS OPT 5 150 (ENDOMECHANICALS) ×4 IMPLANT

## 2020-07-01 NOTE — Interval H&P Note (Signed)
History and Physical Interval Note:  07/01/2020 2:11 PM  Maureen Mercado  has presented today for surgery, with the diagnosis of SYMPTOMATIC BILIARY COLIC, PROBABLE CHRONIC CHOLECYSTITIS.  The various methods of treatment have been discussed with the patient and family. After consideration of risks, benefits and other options for treatment, the patient has consented to  Procedure(s): [place] CHOLANGIOGRAM (N/A) POSSIBLE NEEDLE CORE BIOPSY OF LIVER (N/A) as a surgical intervention.  The patient's history has been reviewed, patient examined, no change in status, stable for surgery.  I have reviewed the patient's chart and labs.  Questions were answered to the patient's satisfaction.    I have re-reviewed the the patient's records, history, medications, and allergies.  I have re-examined the patient.  I again discussed intraoperative plans and goals of post-operative recovery.  The patient agrees to proceed.  Maureen Mercado  [birth year] [id number]  Patient Care Team: Patient, No Pcp Per as PCP - General (General Practice) Michael Boston, MD as Consulting Physician (General Surgery) Otis Brace, MD as Consulting Physician (Gastroenterology)  There are no problems to display for this patient.   Past Medical History:  Diagnosis Date   Anxiety    Depression    Family history of adverse reaction to anesthesia    makes mother sick    GERD (gastroesophageal reflux disease)    Headache    Mitral valve prolapse     Past Surgical History:  Procedure Laterality Date   tip of index finger removed on right hand       Social History   Socioeconomic History   Marital status: Single    Spouse name: Not on file   Number of children: Not on file   Years of education: Not on file   Highest education level: Not on file  Occupational History   Not on file  Tobacco Use   Smoking status: Current Some Day Smoker   Smokeless  tobacco: Former Systems developer   Tobacco comment: last cigarette 06/21/20   Vaping Use   Vaping Use: Former  Substance and Sexual Activity   Alcohol use: Yes    Comment: rarely    Drug use: Never   Sexual activity: Not on file  Other Topics Concern   Not on file  Social History Narrative   Not on file   Social Determinants of Health   Financial Resource Strain:    Difficulty of Paying Living Expenses: Not on file  Food Insecurity:    Worried About Charity fundraiser in the Last Year: Not on file   [employer] of Food in the Last Year: Not on file  Transportation Needs:    Lack of Transportation (Medical): Not on file   Lack of Transportation (Non-Medical): Not on file  Physical Activity:    Days of Exercise per Week: Not on file   Minutes of Exercise per Session: Not on file  Stress:    Feeling of Stress : Not on file  Social Connections:    Frequency of Communication with Friends and Family: Not on file   Frequency of Social Gatherings with Friends and Family: Not on file   Attends Religious Services: Not on file   Active Member of Clubs or Organizations: Not on file   Attends Archivist Meetings: Not on file   Marital Status: Not on file  Intimate Partner Violence:    Fear of Current or Ex-Partner: Not on file   Emotionally Abused: Not on file  Physically Abused: Not on file   Sexually Abused: Not on file    Family History  Problem Relation Age of Onset   Breast cancer Maternal Aunt    Breast cancer Maternal Grandmother    Breast cancer Paternal Grandmother    Breast cancer Maternal Aunt     Medications Prior to Admission  Medication Sig Dispense Refill Last Dose   BIOTIN PO Take 1 tablet by mouth at bedtime.   Past Week at Unknown time   Calcium Carb-Cholecalciferol (CALCIUM + D3 PO) Take 1 tablet by mouth at bedtime.   Past Week at Unknown time   dicyclomine (BENTYL) 10 MG capsule Take 20 mg by mouth 3 (three) times daily as needed for spasms.   Past Month at  Unknown time   ibuprofen (ADVIL) 200 MG tablet Take 800 mg by mouth every 8 (eight) hours as needed (pain.).   Past Week at Unknown time   Melatonin 10 MG TABS Take 10 mg by mouth at bedtime.   06/30/2020 at Unknown time   pantoprazole (PROTONIX) 40 MG tablet Take 40 mg by mouth daily before breakfast.   07/01/2020 at 0600   Probiotic Product (PROBIOTIC PO) Take 1 capsule by mouth at bedtime.   Past Week at Unknown time    Current Facility-Administered Medications  Medication Dose Route Frequency Provider Last Rate Last Admin   bupivacaine liposome (EXPAREL) 1.3 % injection 266 mg  20 mL Infiltration Once Michael Boston, MD       Chlorhexidine Gluconate Cloth 2 % PADS 6 each  6 each Topical Once Michael Boston, MD       And   Chlorhexidine Gluconate Cloth 2 % PADS 6 each  6 each Topical Once Michael Boston, MD       Derrill Memo ON 07/02/2020] feeding supplement (ENSURE PRE-SURGERY) liquid 296 mL  296 mL Oral Once Michael Boston, MD       lactated ringers infusion   Intravenous Continuous Myrtie Soman, MD 10 mL/hr at 07/01/20 1326 New Bag at 07/01/20 1326     Allergies  Allergen Reactions   Iodinated Diagnostic Agents Hives and Itching   Levsin [Hyoscyamine] Rash    BP 121/78   Pulse 96   Temp 98.4 F (36.9 C) (Oral)   Resp 16   Ht 5\' 8"  (1.727 m)   Wt 64.9 kg   LMP 06/02/2020 Comment: urine preg.pending: 07/01/20  SpO2 100%   BMI 21.74 kg/m   Labs: Results for orders placed or performed during the hospital encounter of 07/01/20 (from the past 48 hour(s))  Pregnancy, urine per protocol     Status: None   Collection Time: 07/01/20 12:55 PM  Result Value Ref Range   Preg Test, Ur NEGATIVE NEGATIVE    Comment:        THE SENSITIVITY OF THIS METHODOLOGY IS >20 mIU/mL. Performed at [facility], [place] [address] [place], [place] [address]     Imaging / Studies: No results found.   Adin Hector, M.D., F.A.C.S. Gastrointestinal and Minimally Invasive  Surgery [facility] [address], [place], [place] [address] [phone] Main / Paging  07/01/2020 2:11 PM     Adin Hector

## 2020-07-01 NOTE — Anesthesia Procedure Notes (Signed)
Procedure Name: Intubation Date/Time: 07/01/2020 2:40 PM Performed by: Sharlette Dense, CRNA Patient Re-evaluated:Patient Re-evaluated prior to induction Oxygen Delivery Method: Circle system utilized Preoxygenation: Pre-oxygenation with 100% oxygen Induction Type: IV induction Ventilation: Mask ventilation without difficulty and Oral airway inserted - appropriate to patient size Laryngoscope Size: Sabra Heck and 2 Grade View: Grade I Tube type: Oral Tube size: 7.0 mm Number of attempts: 1 Airway Equipment and Method: Stylet Placement Confirmation: ETT inserted through vocal cords under direct vision,  positive ETCO2 and breath sounds checked- equal and bilateral Secured at: 21 cm Tube secured with: Tape Dental Injury: Teeth and Oropharynx as per pre-operative assessment

## 2020-07-01 NOTE — Discharge Instructions (Signed)
LAPAROSCOPIC SURGERY: POST OP INSTRUCTIONS  ######################################################################  EAT Gradually transition to a high fiber diet with a fiber supplement over the next few weeks after discharge.  Start with a pureed / full liquid diet (see below)  WALK Walk an hour a day.  Control your pain to do that.    CONTROL PAIN Control pain so that you can walk, sleep, tolerate sneezing/coughing, go up/down stairs.  HAVE A BOWEL MOVEMENT DAILY Keep your bowels regular to avoid problems.  OK to try a laxative to override constipation.  OK to use an antidairrheal to slow down diarrhea.  Call if not better after 2 tries  CALL IF YOU HAVE PROBLEMS/CONCERNS Call if you are still struggling despite following these instructions. Call if you have concerns not answered by these instructions  ######################################################################    1. DIET: Follow a light bland diet & liquids the first 24 hours after arrival home, such as soup, liquids, starches, etc.  Be sure to drink plenty of fluids.  Quickly advance to a usual solid diet within a few days.  Avoid fast food or heavy meals as your are more likely to get nauseated or have irregular bowels.  A low-fat, high-fiber diet for the rest of your life is ideal.  2. Take your usually prescribed home medications unless otherwise directed.  3. PAIN CONTROL: a. Pain is best controlled by a usual combination of three different methods TOGETHER: i. Ice/Heat ii. Over the counter pain medication iii. Prescription pain medication b. Most patients will experience some swelling and bruising around the incisions.  Ice packs or heating pads (30-60 minutes up to 6 times a day) will help. Use ice for the first few days to help decrease swelling and bruising, then switch to heat to help relax tight/sore spots and speed recovery.  Some people prefer to use ice alone, heat alone, alternating between ice & heat.   Experiment to what works for you.  Swelling and bruising can take several weeks to resolve.   c. It is helpful to take an over-the-counter pain medication regularly for the first few weeks.  Choose one of the following that works best for you: i. Naproxen (Aleve, etc)  Two 220mg tabs twice a day ii. Ibuprofen (Advil, etc) Three 200mg tabs four times a day (every meal & bedtime) iii. Acetaminophen (Tylenol, etc) 500-650mg four times a day (every meal & bedtime) d. A  prescription for pain medication (such as oxycodone, hydrocodone, tramadol, gabapentin, methocarbamol, etc) should be given to you upon discharge.  Take your pain medication as prescribed.  i. If you are having problems/concerns with the prescription medicine (does not control pain, nausea, vomiting, rash, itching, etc), please call us [phone] to see if we need to switch you to a different pain medicine that will work better for you and/or control your side effect better. ii. If you need a refill on your pain medication, please give us 48 hour notice.  contact your pharmacy.  They will contact our office to request authorization. Prescriptions will not be filled after 5 pm or on week-ends  4. Avoid getting constipated.   a. Between the surgery and the pain medications, it is common to experience some constipation.   b. Increasing fluid intake and taking a fiber supplement (such as Metamucil, Citrucel, FiberCon, MiraLax, etc) 1-2 times a day regularly will usually help prevent this problem from occurring.   c. A mild laxative (prune juice, Milk of Magnesia, MiraLax, etc) should be taken according to   package directions if there are no bowel movements after 48 hours.   5. Watch out for diarrhea.   a. If you have many loose bowel movements, simplify your diet to bland foods & liquids for a few days.   b. Stop any stool softeners and decrease your fiber supplement.   c. Switching to mild anti-diarrheal medications (Kayopectate, Pepto  Bismol) can help.   d. If this worsens or does not improve, please call us.  6. Wash / shower every day.  You may shower over the dressings as they are waterproof.  Continue to shower over incision(s) after the dressing is off.  7. Remove your waterproof bandages 3 days after surgery.  You may leave the incision open to air.  You may replace a dressing/Band-Aid to cover the incision for comfort if you wish.   8. ACTIVITIES as tolerated:   a. You may resume regular (light) daily activities beginning the next day--such as daily self-care, walking, climbing stairs--gradually increasing activities as tolerated.  If you can walk 30 minutes without difficulty, it is safe to try more intense activity such as jogging, treadmill, bicycling, low-impact aerobics, swimming, etc. b. Save the most intensive and strenuous activity for last such as sit-ups, heavy lifting, contact sports, etc  Refrain from any heavy lifting or straining until you are off narcotics for pain control.   c. DO NOT PUSH THROUGH PAIN.  Let pain be your guide: If it hurts to do something, don't do it.  Pain is your body warning you to avoid that activity for another week until the pain goes down. d. You may drive when you are no longer taking prescription pain medication, you can comfortably wear a seatbelt, and you can safely maneuver your car and apply brakes. e. Dennis Bast may have sexual intercourse when it is comfortable.  9. FOLLOW UP in our office a. Please call [facility] at [phone] to set up an appointment to see your surgeon in the office for a follow-up appointment approximately 2-3 weeks after your surgery. b. Make sure that you call for this appointment the day you arrive home to insure a convenient appointment time.  10. IF YOU HAVE DISABILITY OR FAMILY LEAVE FORMS, BRING THEM TO THE OFFICE FOR PROCESSING.  DO NOT GIVE THEM TO YOUR DOCTOR.   WHEN TO CALL us [phone]: 1. Poor pain control 2. Reactions / problems with new  medications (rash/itching, nausea, etc)  3. Fever over 101.5 F (38.5 C) 4. Inability to urinate 5. Nausea and/or vomiting 6. Worsening swelling or bruising 7. Continued bleeding from incision. 8. Increased pain, redness, or drainage from the incision   The clinic staff is available to answer your questions during regular business hours (8:30am-5pm).  Please don't hesitate to call and ask to speak to one of our nurses for clinical concerns.   If you have a medical emergency, go to the nearest emergency room or call 911.  A surgeon from [facility] is always on call at the [facility], [place], [place], [place], [place]  [address] ? MAIN: [phone] ? TOLL FREE: [phone] ?  FAX [phone] [email or web address]

## 2020-07-01 NOTE — Anesthesia Preprocedure Evaluation (Addendum)
Anesthesia Evaluation  Patient identified by MRN, date of birth, ID band Patient awake    Reviewed: Allergy & Precautions, NPO status , Patient's Chart, lab work & pertinent test results  History of Anesthesia Complications (+) Family history of anesthesia reaction  Airway Mallampati: I  TM Distance: <3 FB Neck ROM: Full    Dental no notable dental hx.    Pulmonary Current Smoker and Patient abstained from smoking.,    Pulmonary exam normal + rhonchi        Cardiovascular Exercise Tolerance: Good Normal cardiovascular exam Rhythm:Regular Rate:Normal     Neuro/Psych  Headaches, PSYCHIATRIC DISORDERS Anxiety Depression    GI/Hepatic Neg liver ROS, GERD  ,Chronic cholecystitis Biliary colic   Endo/Other  negative endocrine ROS  Renal/GU negative Renal ROS     Musculoskeletal negative musculoskeletal ROS (+)   Abdominal Normal abdominal exam  (+)   Peds negative pediatric ROS (+)  Hematology negative hematology ROS (+)   Anesthesia Other Findings   Reproductive/Obstetrics negative OB ROS                            Anesthesia Physical Anesthesia Plan  ASA: II  Anesthesia Plan: General   Post-op Pain Management:    Induction: Intravenous  PONV Risk Score and Plan: 2 and Midazolam, Dexamethasone, Ondansetron and Treatment may vary due to age or medical condition  Airway Management Planned: Oral ETT  Additional Equipment:   Intra-op Plan:   Post-operative Plan: Extubation in OR  Informed Consent: I have reviewed the patients History and Physical, chart, labs and discussed the procedure including the risks, benefits and alternatives for the proposed anesthesia with the patient or authorized representative who has indicated his/her understanding and acceptance.     Dental advisory given  Plan Discussed with: Anesthesiologist and CRNA  Anesthesia Plan Comments:         Anesthesia Quick Evaluation

## 2020-07-01 NOTE — Transfer of Care (Signed)
Immediate Anesthesia Transfer of Care Note  Patient: Maureen Mercado  Procedure(s) Performed: LAPAROSCOPIC CHOLECYSTECTOMY SINGLE SITE WITH INTRAOPERATIVE CHOLANGIOGRAM, EXCISION OF SKIN MASS (N/A Abdomen)  Patient Location: PACU  Anesthesia Type:General  Level of Consciousness: awake, drowsy and responds to stimulation  Airway & Oxygen Therapy: Patient Spontanous Breathing and Patient connected to face mask oxygen  Post-op Assessment: Report given to RN and Post -op Vital signs reviewed and stable  Post vital signs: Reviewed and stable  Last Vitals:  Vitals Value Taken Time  BP 130/62 07/01/20 1634  Temp    Pulse 108 07/01/20 1636  Resp 22 07/01/20 1636  SpO2 100 % 07/01/20 1636  Vitals shown include unvalidated device data.  Last Pain:  Vitals:   07/01/20 1313  TempSrc:   PainSc: 3       Patients Stated Pain Goal: 2 (97/28/20 6015)  Complications: No complications documented.

## 2020-07-01 NOTE — Anesthesia Postprocedure Evaluation (Signed)
Anesthesia Post Note  Patient: Maureen Mercado  Procedure(s) Performed: LAPAROSCOPIC CHOLECYSTECTOMY SINGLE SITE WITH INTRAOPERATIVE CHOLANGIOGRAM, EXCISION OF SKIN MASS (N/A Abdomen)     Patient location during evaluation: PACU Anesthesia Type: General Level of consciousness: awake and alert Pain management: pain level controlled Vital Signs Assessment: post-procedure vital signs reviewed and stable Respiratory status: spontaneous breathing, nonlabored ventilation and respiratory function stable Cardiovascular status: blood pressure returned to baseline and stable Postop Assessment: no apparent nausea or vomiting Anesthetic complications: no   No complications documented.  Last Vitals:  Vitals:   07/01/20 1715 07/01/20 1730  BP: 123/77 126/82  Pulse: 92 93  Resp: 17 19  Temp:  36.7 C  SpO2: 93% 95%    Last Pain:  Vitals:   07/01/20 1730  TempSrc:   PainSc: 4                  Candra R Brent Noto

## 2020-07-01 NOTE — Op Note (Signed)
07/01/2020  PATIENT:  Maureen Mercado  44 y.o. female  Patient Care Team: Patient, No Pcp Per as PCP - General (General Practice) Michael Boston, MD as Consulting Physician (General Surgery) Otis Brace, MD as Consulting Physician (Gastroenterology)  PRE-OPERATIVE DIAGNOSIS:    Chronic Cholecystitis  POST-OPERATIVE DIAGNOSIS:   Chronic Cholecystitis  Atypical nevus - right supraumbillcal skin  PROCEDURE:  SINGLE SITE Laparoscopic cholecystectomy with intraoperative cholangiogram  Excision of nevus off abdominal wall  SURGEON:  Adin Hector, MD, FACS.  ASSISTANT: Daiva Huge, MD, PGY-7, [facility]  I was personally present during the key and critical portions of this procedure and immediately available throughout the entire procedure, as documented in my operative note.    ANESTHESIA:    General with endotracheal intubation Local anesthetic as a field block  EBL:  (See Anesthesia Intraoperative Record) Total I/O In: 1000 [I.V.:1000] Out: 25 [Blood:25]  Delay start of Pharmacological VTE agent (>24hrs) due to surgical blood loss or risk of bleeding:  no  DRAINS: None   SPECIMEN: Gallbladder    DISPOSITION OF SPECIMEN:  PATHOLOGY  COUNTS:  YES  PLAN OF CARE: Discharge to home after PACU  PATIENT DISPOSITION:  PACU - hemodynamically stable.  INDICATION: Pleasant woman struggling with intermittent upper abdominal pain and nausea.  Extensive work-up by gastroenterology without any other etiology.  No stones in gallbladder function somewhat adequate but recurrent biliary colic with nuclear medicine HIDA scan.  No improvements with gastrology interventions.  Cholecystectomy offered.  The anatomy & physiology of hepatobiliary & pancreatic function was discussed.  The pathophysiology of gallbladder dysfunction was discussed.  Natural history risks without surgery was discussed.   I feel the risks of no intervention will lead to serious problems that  outweigh the operative risks; therefore, I recommended cholecystectomy to remove the pathology.  I explained laparoscopic techniques with possible need for an open approach.  Probable cholangiogram to evaluate the bilary tract was explained as well.    Risks such as bleeding, infection, abscess, leak, injury to other organs, need for further treatment, heart attack, death, and other risks were discussed.  I noted a good likelihood this will help address the problem.  Possibility that this will not correct all abdominal symptoms was explained.  Goals of post-operative recovery were discussed as well.  We will work to minimize complications.  An educational handout further explaining the pathology and treatment options was given as well.  Questions were answered.  The patient expresses understanding & wishes to proceed with surgery.  OR FINDINGS: Gallbladder wall thickening with some irritation and adhesions suspicious for chronic acalculous cholecystitis.  Very narrow cystic duct.  Suspicious for partial cystic duct obstruction most likely the etiology of her biliary colic/dyskinesia.  Liver: normal   5 x 5 mm atypical nevus in the right supraumbilical region about a centimeter superior to the lateral corner of the umbilicus.  Some elevation and atypical discoloration.  Therefore excised within supraumbilical incision  DESCRIPTION:   The patient was identified & brought in the operating room. The patient was positioned supine with arms tucked. SCDs were active during the entire case. The patient underwent general anesthesia without any difficulty.  The abdomen was prepped and draped in a sterile fashion. A Surgical Timeout confirmed our plan.  We made a transverse curvilinear incision through the superior umbilical fold.  We took a crescent of supraumbilical skin to include an atypical nevus that was in the right supraumbilical fold.  We placed a 75mm long port  through the supraumbilical fascia using a  modified Hassan cutdown technique with umbilical stalk fascial countertraction. I began carbon dioxide insufflation.  No change in end tidal CO2 measurement.   Camera inspection revealed no injury. There were no adhesions to the anterior abdominal wall supraumbilically.  We proceeded to continue with single site technique. I placed a #5 port in left upper aspect of the wound. I placed a 5 mm atraumatic grasper in the right inferior aspect of the wound.  We turned attention to the right upper quadrant.  There is some wall thickening as well as omental adhesions.  The gallbladder fundus was elevated cephalad. I freed adhesions to the ventral surface of the gallbladder off carefully.  Also freed omental adhesions to the liver edge and undersurface to allow better mobility.  Liver did not have any fatty change or cirrhosis and appeared normal.  We freed the peritoneal coverings between the gallbladder and the liver on the posteriolateral and anteriomedial walls. I alternated between Harmonic & blunt Maryland dissection to help get a good critical view of the cystic artery and cystic duct.  did further dissection to free 80%of the gallbladder off the liver bed to get a good critical view of the infundibulum and cystic duct. I dissected out the cystic artery; and, after getting a good 360 view, ligated the anterior & posterior branches of the cystic artery close on the infundibulum using the Harmonic ultrasonic dissection.  There is some thickening and inflammation around the infundibulum.   There was concern of intravenous contrast allergy with some hives.  Discussed with pharmacy who felt the likelihood of a similar reaction with cholangiogram was low.  Nonetheless the patient did receive IV Decadron steroids preop.  We added IV Benadryl and Pepcid for H1 and H2 blockade intravenously prior to cholangiography. We skeletonized the cystic duct.  It was very narrow and fibrotic.  We placed a clip on the infundibulum.  I did a partial cystic duct-otomy and ensured patency. We placed a 5 [place] cholangiocatheter through a puncture site at the right subcostal ridge of the abdominal wall and directed it into the cystic duct.  We ran a cholangiogram with dilute radio-opaque contrast and continuous fluoroscopy.  We noted contrast flowed from a side branch consistent with cystic duct cannulization. Contrast flowed up the common hepatic duct into the right and left intrahepatic chains out to secondary radicals. Contrast flowed down the common bile duct easily across the normal ampulla into the duodenum.  This was consistent with a normal cholangiogram.  We removed the cholangiocatheter. I placed clips on the cystic duct x3.  We completed cystic duct transection. I freed the gallbladder from its remaining attachments to the liver. I ensured hemostasis on the gallbladder fossa of the liver and elsewhere. We inspected the rest of the abdomen & detected no injury nor bleeding elsewhere.  Because the liver looked completely normal, did not feel you need to do liver biopsy.  We removed the gallbladder out the supraumbilical fascia. 66mm fascial defect needed given some of the gallbladder wall thickening.  We felt no obvious stones.  We closed the fascia transversely using #1 PDS interrupted stitches. I closed the skin using 4-0 monocryl stitch.  Sterile dressing was applied. The patient was extubated & arrived in the PACU in stable condition.  We saw no evidence of any hives or any other skin changes concerning for allergic reaction.  I had discussed postoperative care with the patient in the holding area. I discussed operative  findings, updated the patient's status, discussed probable steps to recovery, and gave postoperative recommendations to the patient's mother, Denyce Robert.  Recommendations were made.  Questions were answered.  She expressed understanding & appreciation.  Adin Hector, M.D., F.A.C.S. Gastrointestinal and  Minimally Invasive Surgery [facility] [address] [place], [place] [address] [phone] Main / Paging  07/01/2020 4:29 PM

## 2020-07-02 ENCOUNTER — Encounter (HOSPITAL_COMMUNITY): Payer: Self-pay | Admitting: Surgery

## 2020-07-06 LAB — SURGICAL PATHOLOGY

## 2020-08-06 ENCOUNTER — Ambulatory Visit
Admission: RE | Admit: 2020-08-06 | Discharge: 2020-08-06 | Disposition: A | Discharge status: Home / Self Care | Payer: PRIVATE HEALTH INSURANCE | Source: Ambulatory Visit | Attending: Nurse Practitioner | Admitting: Nurse Practitioner

## 2020-08-06 ENCOUNTER — Other Ambulatory Visit: Payer: Self-pay

## 2020-08-06 DIAGNOSIS — N6320 Unspecified lump in the left breast, unspecified quadrant: Secondary | ICD-10-CM

## 2020-08-06 DIAGNOSIS — R921 Mammographic calcification found on diagnostic imaging of breast: Secondary | ICD-10-CM

## 2020-09-09 ENCOUNTER — Other Ambulatory Visit: Payer: Self-pay | Admitting: Family Medicine

## 2020-09-09 ENCOUNTER — Other Ambulatory Visit: Payer: Self-pay | Admitting: *Deleted

## 2020-09-09 DIAGNOSIS — R928 Other abnormal and inconclusive findings on diagnostic imaging of breast: Secondary | ICD-10-CM

## 2020-09-15 ENCOUNTER — Other Ambulatory Visit: Payer: Self-pay | Admitting: Obstetrics and Gynecology

## 2020-09-15 DIAGNOSIS — R928 Other abnormal and inconclusive findings on diagnostic imaging of breast: Secondary | ICD-10-CM

## 2020-11-21 IMAGING — NM NM HEPATO W/GB/PHARM/[PERSON_NAME]
2 series · 12 of 12 positions shown · non-contrast
Comparison: None.

CLINICAL DATA: Upper abdominal pain with nausea

EXAM:
NUCLEAR MEDICINE HEPATOBILIARY IMAGING WITH GALLBLADDER EF
VIEWS:
Anterior right upper quadrant
RADIOPHARMACEUTICALS:  5.2 mCi Fc-33m  Choletec IV

[he hepatobiliary · 4.52mm/px · 6 of 60 frames shown (1 of 2)]
[frame 6/60]
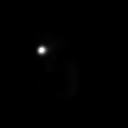
[frame 16/60]
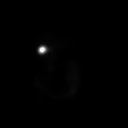
[frame 26/60]
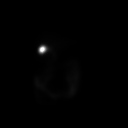
[frame 36/60]
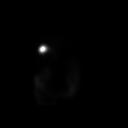
[frame 46/60]
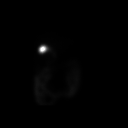
[frame 56/60]
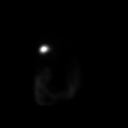

[he hepatobiliary · 4.52mm/px · 6 of 60 frames shown (2 of 2)]
[frame 6/60]
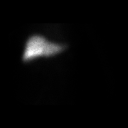
[frame 16/60]
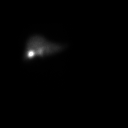
[frame 26/60]
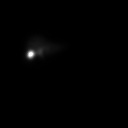
[frame 36/60]
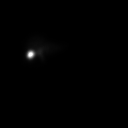
[frame 46/60]
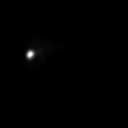
[frame 56/60]
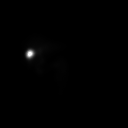

[12 of 12 positions shown; findings below may reference images not displayed]

FINDINGS: Liver uptake of radiotracer is unremarkable. There is prompt
visualization of gallbladder and small bowel, indicating patency of
the cystic and common bile ducts. The patient consumed 8 ounces of
Ensure orally with calculation of the computer generated ejection
fraction of radiotracer from the gallbladder. The patient did not
experience clinical symptoms with the oral Ensure consumption. The
computer generated ejection fraction of radiotracer from the
gallbladder is normal at 45%, normal greater than 33% using the oral
agent.
IMPRESSION: Study within normal limits.

## 2021-01-17 ENCOUNTER — Other Ambulatory Visit: Payer: PRIVATE HEALTH INSURANCE

## 2021-02-16 ENCOUNTER — Other Ambulatory Visit: Payer: Self-pay

## 2021-02-16 ENCOUNTER — Other Ambulatory Visit: Payer: Self-pay | Admitting: Obstetrics and Gynecology

## 2021-02-16 ENCOUNTER — Ambulatory Visit
Admission: RE | Admit: 2021-02-16 | Discharge: 2021-02-16 | Disposition: A | Discharge status: Home / Self Care | Payer: PRIVATE HEALTH INSURANCE | Source: Ambulatory Visit | Attending: Obstetrics and Gynecology | Admitting: Obstetrics and Gynecology

## 2021-02-16 DIAGNOSIS — R928 Other abnormal and inconclusive findings on diagnostic imaging of breast: Secondary | ICD-10-CM

## 2021-05-28 IMAGING — MG DIGITAL DIAGNOSTIC BILAT W/ TOMO W/ CAD
8 of 17 series · 8 of 40 positions shown · non-contrast
Comparison: Screening mammography January 16, 2020
COMPARISON: Screening mammography January 16, 2020

Addendum:
CLINICAL DATA: The patient was called back for a left breast mass,
left breast calcifications, a left breast asymmetry, and a possible
right breast mass.

EXAM:
DIGITAL DIAGNOSTIC BILATERAL MAMMOGRAM WITH TOMO
ULTRASOUND BILATERAL BREAST

[L ML (1 of 3)]
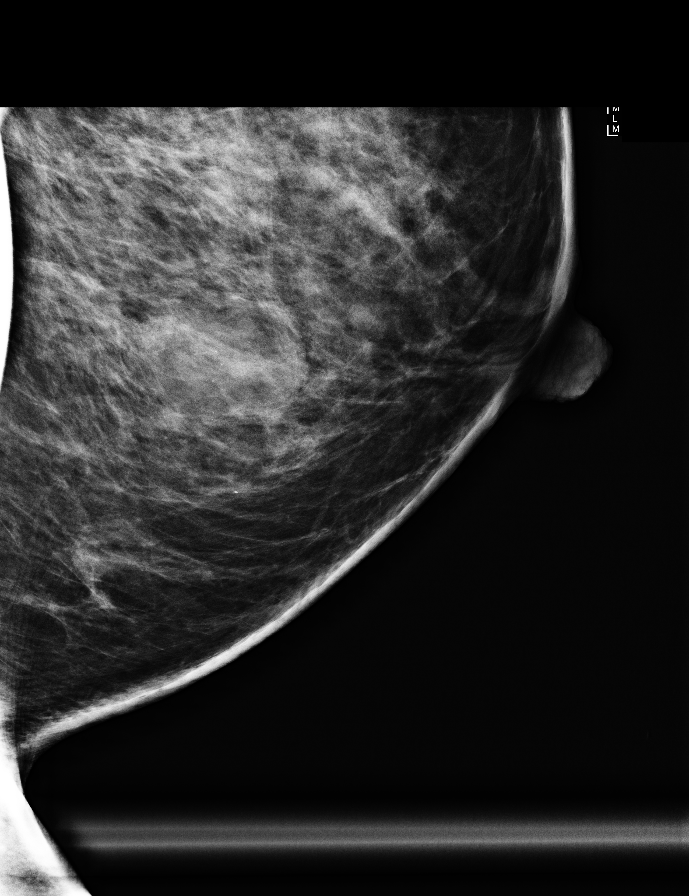

[L ML (2 of 3)]
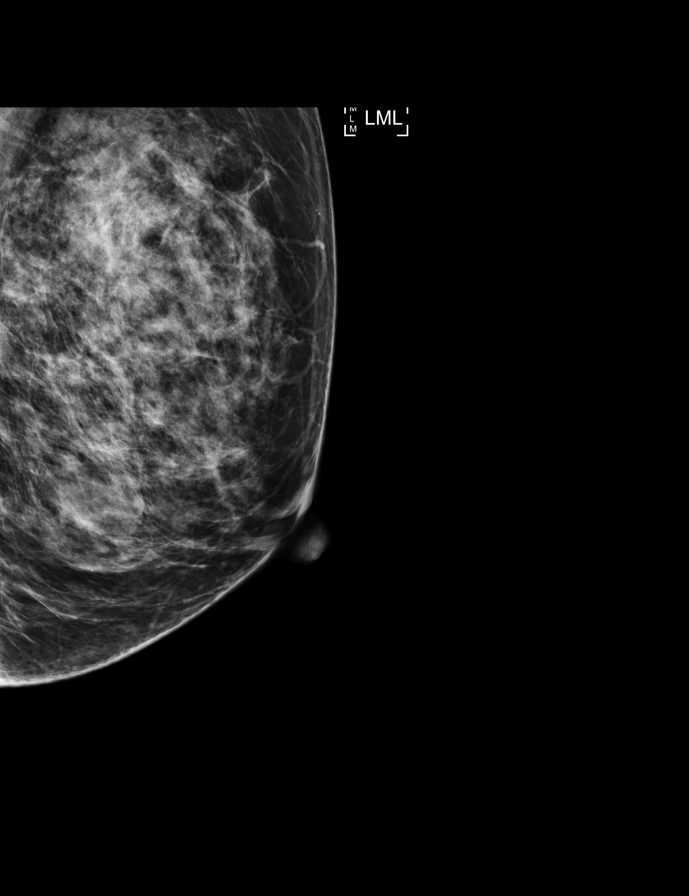

[L CC (1 of 2)]
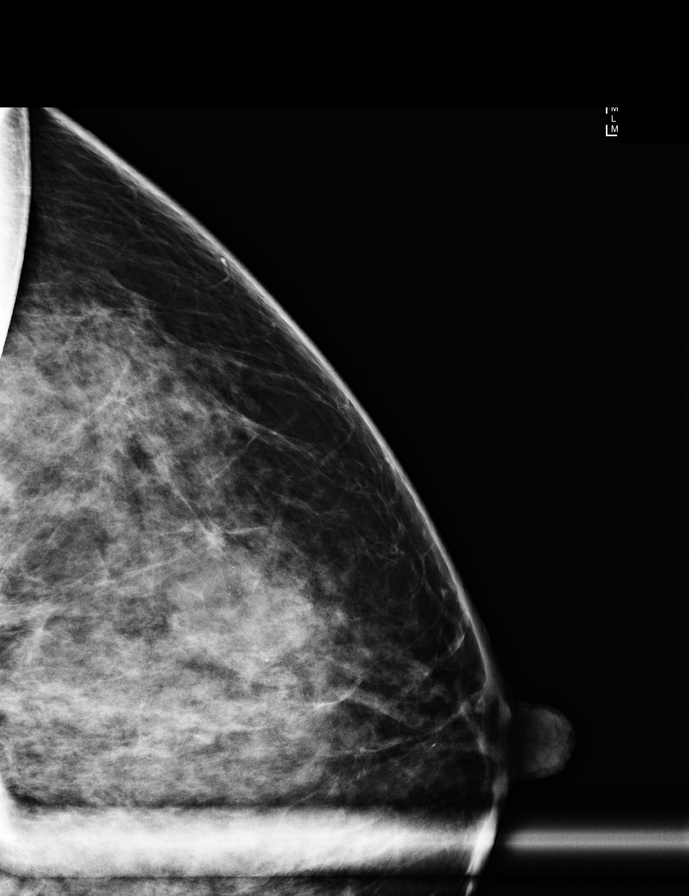

[L ML (3 of 3)]
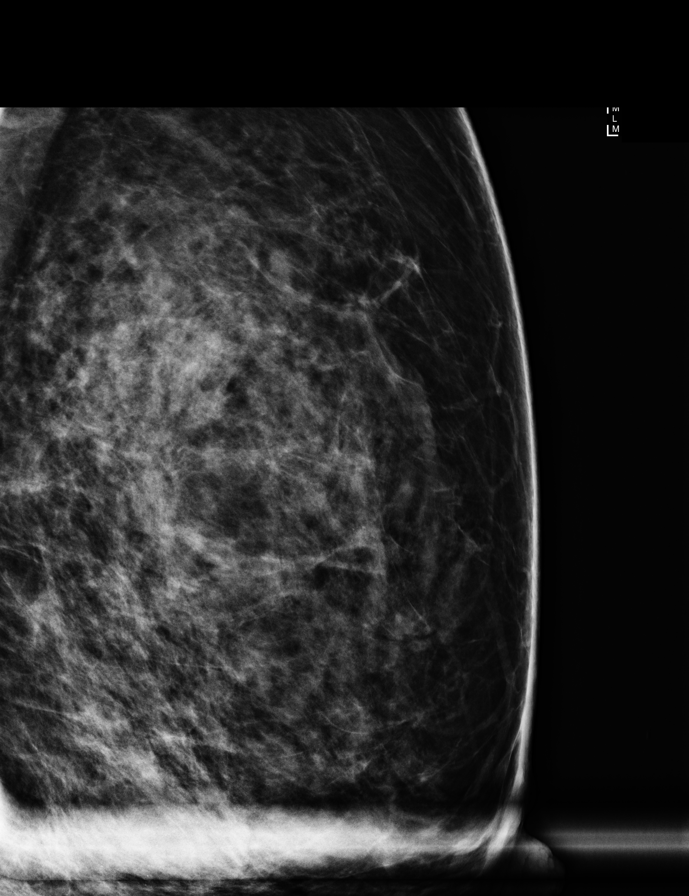

[L CC (2 of 2)]
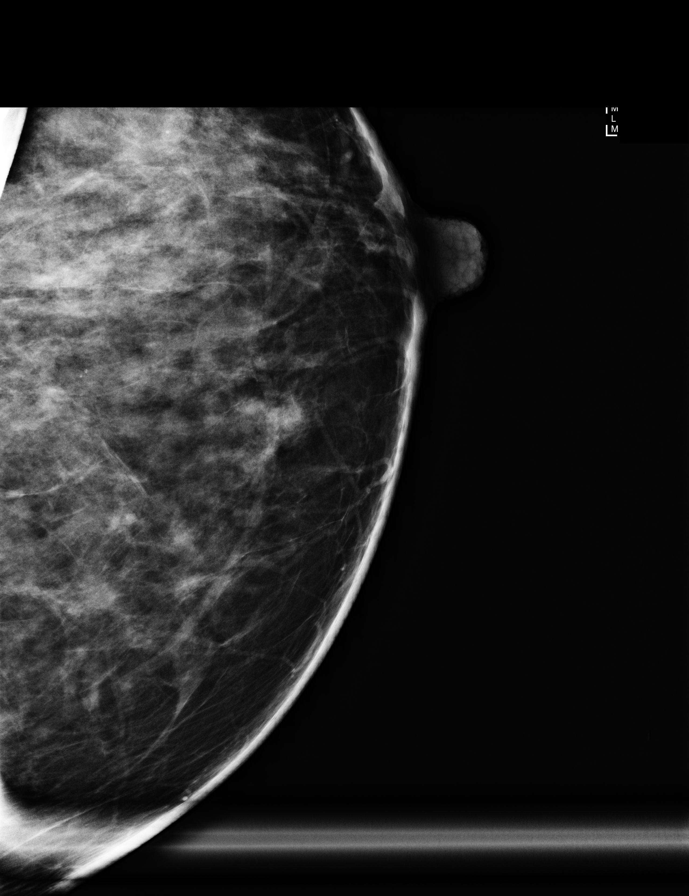

[R MLO synth-2D]
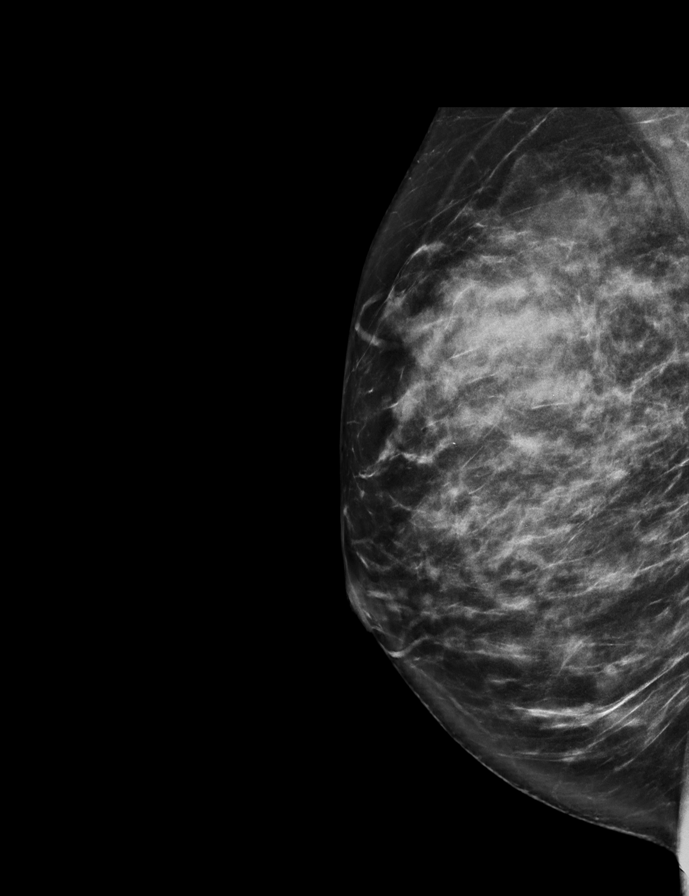

[R ML synth-2D]
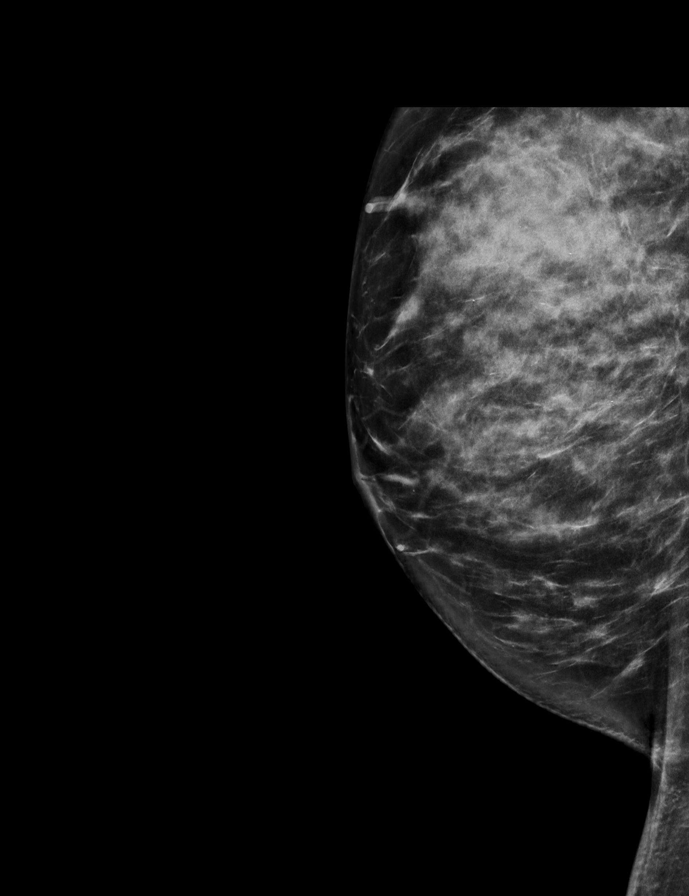

[L MLO synth-2D]
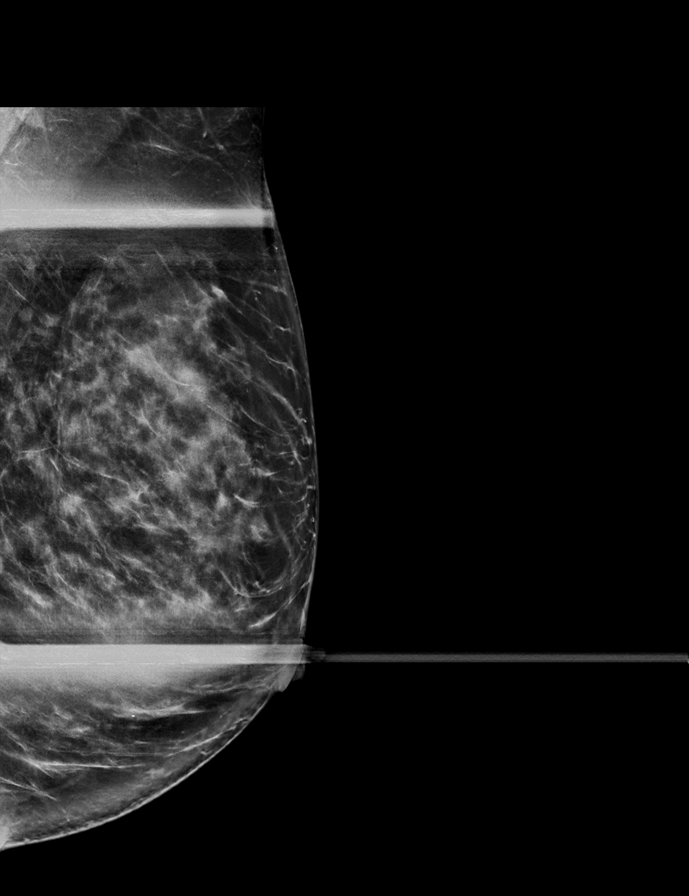

[8 of 40 positions shown; findings below may reference images not displayed]

ACR Breast Density Category c: The breast tissue is heterogeneously
dense, which may obscure small masses.
FINDINGS: The mass seen on the right MLO view persists on additional imaging.
There are actually 3 masses in this region, likely intramammary
lymph nodes but not definitive on mammography.

The asymmetry seen on the left MLO view resolves with additional
imaging. The mass in the lower outer left breast is isodense and
contains several punctate calcifications. The calcifications in the
upper inner left breast persists on additional imaging and span 3 mm
on the 90 degree lateral magnification view.

On physical exam, no suspicious lumps are identified.

Targeted ultrasound is performed, showing several intramammary lymph
nodes on the right accounting for the mammographic findings.

There is a hypoechoic mass with parallel orientation containing
several calcifications, accounting for the mammographically
identified mass. This mass measures 2.7 by 1.3 by 1.1 cm.
IMPRESSION: Probably benign left breast mass. Probably benign left breast
calcifications. No other suspicious findings.

RECOMMENDATION:
Six-month follow-up mammography and ultrasound of the left breast
probably benign mass and calcifications.

I have discussed the findings and recommendations with the patient.
If applicable, a reminder letter will be sent to the patient
regarding the next appointment.

BI-RADS CATEGORY  3: Probably benign.

ADDENDUM:
This is an addendum to the previous report.
IMPRESSION: There is a probably benign left breast mass. There are probably
benign left breast calcifications.

The mass in the right breast seen on screening mammography
represents an intramammary lymph node as confirmed with ultrasound.
The right-sided intramammary lymph node is of no concern.

Recommendation: Six-month follow-up mammography and ultrasound of
the left breast probably benign mass and calcifications. The
intramammary lymph node on the right is benign and requires no
additional imaging follow-up.

BI-RADS category 3: Probably benign.

*** End of Addendum ***
ACR Breast Density Category c: The breast tissue is heterogeneously
dense, which may obscure small masses.
FINDINGS: The mass seen on the right MLO view persists on additional imaging.
There are actually 3 masses in this region, likely intramammary
lymph nodes but not definitive on mammography.

The asymmetry seen on the left MLO view resolves with additional
imaging. The mass in the lower outer left breast is isodense and
contains several punctate calcifications. The calcifications in the
upper inner left breast persists on additional imaging and span 3 mm
on the 90 degree lateral magnification view.

On physical exam, no suspicious lumps are identified.

Targeted ultrasound is performed, showing several intramammary lymph
nodes on the right accounting for the mammographic findings.

There is a hypoechoic mass with parallel orientation containing
several calcifications, accounting for the mammographically
identified mass. This mass measures 2.7 by 1.3 by 1.1 cm.
IMPRESSION: Probably benign left breast mass. Probably benign left breast
calcifications. No other suspicious findings.

RECOMMENDATION:
Six-month follow-up mammography and ultrasound of the left breast
probably benign mass and calcifications.

I have discussed the findings and recommendations with the patient.
If applicable, a reminder letter will be sent to the patient
regarding the next appointment.

BI-RADS CATEGORY  3: Probably benign.

## 2022-03-08 ENCOUNTER — Other Ambulatory Visit: Payer: Self-pay | Admitting: Obstetrics and Gynecology

## 2022-03-08 DIAGNOSIS — R921 Mammographic calcification found on diagnostic imaging of breast: Secondary | ICD-10-CM

## 2022-03-08 DIAGNOSIS — N63 Unspecified lump in unspecified breast: Secondary | ICD-10-CM

## 2022-03-20 ENCOUNTER — Other Ambulatory Visit: Payer: Self-pay | Admitting: Obstetrics and Gynecology

## 2022-03-20 ENCOUNTER — Ambulatory Visit
Admission: RE | Admit: 2022-03-20 | Discharge: 2022-03-20 | Disposition: A | Discharge status: Home / Self Care | Payer: No Typology Code available for payment source | Source: Ambulatory Visit | Attending: Obstetrics and Gynecology | Admitting: Obstetrics and Gynecology

## 2022-03-20 ENCOUNTER — Ambulatory Visit
Admission: RE | Admit: 2022-03-20 | Discharge: 2022-03-20 | Disposition: A | Discharge status: Home / Self Care | Payer: PRIVATE HEALTH INSURANCE | Source: Ambulatory Visit | Attending: Obstetrics and Gynecology | Admitting: Obstetrics and Gynecology

## 2022-03-20 DIAGNOSIS — R921 Mammographic calcification found on diagnostic imaging of breast: Secondary | ICD-10-CM

## 2022-03-20 DIAGNOSIS — N63 Unspecified lump in unspecified breast: Secondary | ICD-10-CM

## 2022-09-20 ENCOUNTER — Ambulatory Visit
Admission: RE | Admit: 2022-09-20 | Discharge: 2022-09-20 | Disposition: A | Discharge status: Home / Self Care | Payer: No Typology Code available for payment source | Source: Ambulatory Visit | Attending: Obstetrics and Gynecology | Admitting: Obstetrics and Gynecology

## 2022-09-20 ENCOUNTER — Other Ambulatory Visit: Payer: Self-pay | Admitting: Obstetrics and Gynecology

## 2022-09-20 DIAGNOSIS — N63 Unspecified lump in unspecified breast: Secondary | ICD-10-CM

## 2022-09-20 DIAGNOSIS — N6489 Other specified disorders of breast: Secondary | ICD-10-CM

## 2023-03-23 ENCOUNTER — Ambulatory Visit
Admission: RE | Admit: 2023-03-23 | Discharge: 2023-03-23 | Disposition: A | Discharge status: Home / Self Care | Payer: No Typology Code available for payment source | Source: Ambulatory Visit | Attending: Obstetrics and Gynecology | Admitting: Obstetrics and Gynecology

## 2023-03-23 DIAGNOSIS — N6489 Other specified disorders of breast: Secondary | ICD-10-CM

## 2023-03-28 ENCOUNTER — Other Ambulatory Visit: Payer: Self-pay | Admitting: Family Medicine

## 2023-03-28 DIAGNOSIS — R109 Unspecified abdominal pain: Secondary | ICD-10-CM

## 2023-03-30 ENCOUNTER — Other Ambulatory Visit: Payer: Self-pay | Admitting: Obstetrics and Gynecology

## 2023-03-30 DIAGNOSIS — N6489 Other specified disorders of breast: Secondary | ICD-10-CM

## 2023-04-23 ENCOUNTER — Ambulatory Visit
Admission: RE | Admit: 2023-04-23 | Discharge: 2023-04-23 | Disposition: A | Discharge status: Home / Self Care | Payer: No Typology Code available for payment source | Source: Ambulatory Visit | Attending: Family Medicine | Admitting: Family Medicine

## 2023-04-23 DIAGNOSIS — R109 Unspecified abdominal pain: Secondary | ICD-10-CM

## 2023-09-24 ENCOUNTER — Other Ambulatory Visit: Payer: Self-pay | Admitting: Obstetrics and Gynecology

## 2023-09-24 ENCOUNTER — Ambulatory Visit
Admission: RE | Admit: 2023-09-24 | Discharge: 2023-09-24 | Disposition: A | Discharge status: Home / Self Care | Payer: No Typology Code available for payment source | Source: Ambulatory Visit | Attending: Obstetrics and Gynecology | Admitting: Obstetrics and Gynecology

## 2023-09-24 DIAGNOSIS — N6489 Other specified disorders of breast: Secondary | ICD-10-CM
